# Patient Record
Sex: Female | Born: 1960 | Race: Black or African American | Hispanic: No | Marital: Single | Smoking: Former smoker
Health system: Southern US, Community
[De-identification: ages and names within clinical notes are randomized; demographics above are authoritative.]

## PROBLEM LIST (undated history)

## (undated) DIAGNOSIS — L84 Corns and callosities: Secondary | ICD-10-CM

## (undated) DIAGNOSIS — I309 Acute pericarditis, unspecified: Secondary | ICD-10-CM

## (undated) DIAGNOSIS — F329 Major depressive disorder, single episode, unspecified: Secondary | ICD-10-CM

## (undated) DIAGNOSIS — R45851 Suicidal ideations: Secondary | ICD-10-CM

## (undated) DIAGNOSIS — F32A Depression, unspecified: Secondary | ICD-10-CM

## (undated) HISTORY — DX: Corns and callosities: L84

## (undated) HISTORY — DX: Acute pericarditis, unspecified: I30.9

## (undated) HISTORY — PX: NO PAST SURGERIES: SHX2092

## (undated) HISTORY — PX: CARDIAC CATHETERIZATION: SHX172

---

## 1997-12-18 ENCOUNTER — Encounter: Payer: Self-pay | Admitting: Emergency Medicine

## 1997-12-18 ENCOUNTER — Inpatient Hospital Stay (HOSPITAL_COMMUNITY): Admission: EM | Admit: 1997-12-18 | Discharge: 1997-12-19 | Payer: Self-pay | Admitting: Emergency Medicine

## 1997-12-22 ENCOUNTER — Ambulatory Visit (HOSPITAL_COMMUNITY): Admission: RE | Admit: 1997-12-22 | Discharge: 1997-12-22 | Payer: Self-pay | Admitting: Internal Medicine

## 1998-07-22 ENCOUNTER — Emergency Department (HOSPITAL_COMMUNITY): Admission: EM | Admit: 1998-07-22 | Discharge: 1998-07-23 | Payer: Self-pay | Admitting: Emergency Medicine

## 1999-06-25 ENCOUNTER — Encounter: Payer: Self-pay | Admitting: Emergency Medicine

## 1999-06-25 ENCOUNTER — Emergency Department (HOSPITAL_COMMUNITY): Admission: EM | Admit: 1999-06-25 | Discharge: 1999-06-25 | Payer: Self-pay | Admitting: Emergency Medicine

## 2002-10-04 ENCOUNTER — Inpatient Hospital Stay (HOSPITAL_COMMUNITY): Admission: AD | Admit: 2002-10-04 | Discharge: 2002-10-09 | Payer: Self-pay | Admitting: Psychiatry

## 2003-07-12 ENCOUNTER — Other Ambulatory Visit: Admission: RE | Admit: 2003-07-12 | Discharge: 2003-07-12 | Payer: Self-pay | Admitting: Family Medicine

## 2003-11-04 ENCOUNTER — Ambulatory Visit: Payer: Self-pay | Admitting: Internal Medicine

## 2004-05-26 ENCOUNTER — Ambulatory Visit: Payer: Self-pay | Admitting: Internal Medicine

## 2004-05-29 ENCOUNTER — Ambulatory Visit: Payer: Self-pay | Admitting: Internal Medicine

## 2004-07-24 ENCOUNTER — Ambulatory Visit: Payer: Self-pay | Admitting: Internal Medicine

## 2004-10-16 ENCOUNTER — Ambulatory Visit: Payer: Self-pay | Admitting: Internal Medicine

## 2005-11-16 ENCOUNTER — Ambulatory Visit: Payer: Self-pay | Admitting: Family Medicine

## 2005-12-03 ENCOUNTER — Ambulatory Visit: Payer: Self-pay | Admitting: Family Medicine

## 2005-12-27 ENCOUNTER — Ambulatory Visit: Payer: Self-pay | Admitting: Internal Medicine

## 2006-02-19 ENCOUNTER — Ambulatory Visit (HOSPITAL_COMMUNITY): Admission: RE | Admit: 2006-02-19 | Discharge: 2006-02-19 | Payer: Self-pay | Admitting: Family Medicine

## 2006-02-22 ENCOUNTER — Ambulatory Visit: Payer: Self-pay | Admitting: Internal Medicine

## 2006-04-10 ENCOUNTER — Ambulatory Visit: Payer: Self-pay | Admitting: *Deleted

## 2006-10-16 ENCOUNTER — Encounter (INDEPENDENT_AMBULATORY_CARE_PROVIDER_SITE_OTHER): Payer: Self-pay | Admitting: *Deleted

## 2007-09-14 ENCOUNTER — Emergency Department (HOSPITAL_COMMUNITY): Admission: EM | Admit: 2007-09-14 | Discharge: 2007-09-14 | Payer: Self-pay | Admitting: Family Medicine

## 2008-08-04 ENCOUNTER — Emergency Department (HOSPITAL_COMMUNITY): Admission: EM | Admit: 2008-08-04 | Discharge: 2008-08-04 | Payer: Self-pay | Admitting: Emergency Medicine

## 2009-09-23 ENCOUNTER — Ambulatory Visit (HOSPITAL_COMMUNITY): Admission: RE | Admit: 2009-09-23 | Discharge: 2009-09-23 | Payer: Self-pay | Admitting: Internal Medicine

## 2009-09-23 ENCOUNTER — Ambulatory Visit: Payer: Self-pay | Admitting: Internal Medicine

## 2009-09-23 DIAGNOSIS — R079 Chest pain, unspecified: Secondary | ICD-10-CM

## 2009-09-23 DIAGNOSIS — F410 Panic disorder [episodic paroxysmal anxiety] without agoraphobia: Secondary | ICD-10-CM | POA: Insufficient documentation

## 2009-09-23 DIAGNOSIS — A5400 Gonococcal infection of lower genitourinary tract, unspecified: Secondary | ICD-10-CM | POA: Insufficient documentation

## 2009-09-23 DIAGNOSIS — G5600 Carpal tunnel syndrome, unspecified upper limb: Secondary | ICD-10-CM

## 2009-09-23 DIAGNOSIS — F329 Major depressive disorder, single episode, unspecified: Secondary | ICD-10-CM | POA: Insufficient documentation

## 2009-09-23 DIAGNOSIS — E049 Nontoxic goiter, unspecified: Secondary | ICD-10-CM | POA: Insufficient documentation

## 2009-09-23 DIAGNOSIS — H524 Presbyopia: Secondary | ICD-10-CM | POA: Insufficient documentation

## 2009-09-23 DIAGNOSIS — Z8619 Personal history of other infectious and parasitic diseases: Secondary | ICD-10-CM

## 2009-09-26 ENCOUNTER — Ambulatory Visit: Payer: Self-pay | Admitting: Physician Assistant

## 2009-09-26 ENCOUNTER — Encounter (INDEPENDENT_AMBULATORY_CARE_PROVIDER_SITE_OTHER): Payer: Self-pay | Admitting: Internal Medicine

## 2009-09-26 LAB — CONVERTED CEMR LAB
AST: 15 units/L (ref 0–37)
Albumin: 4.5 g/dL (ref 3.5–5.2)
BUN: 11 mg/dL (ref 6–23)
Basophils Relative: 1 % (ref 0–1)
Calcium: 9.7 mg/dL (ref 8.4–10.5)
Chloride: 103 meq/L (ref 96–112)
Cholesterol: 228 mg/dL — ABNORMAL HIGH (ref 0–200)
Creatinine, Ser: 0.7 mg/dL (ref 0.40–1.20)
Glucose, Bld: 93 mg/dL (ref 70–99)
HDL: 51 mg/dL (ref >39)
Hemoglobin: 13.6 g/dL (ref 12.0–15.0)
Lymphs Abs: 1.9 10*3/uL (ref 0.7–4.0)
MCHC: 31.7 g/dL (ref 30.0–36.0)
MCV: 89 fL (ref 78.0–100.0)
Monocytes Absolute: 0.6 10*3/uL (ref 0.1–1.0)
Monocytes Relative: 12 % (ref 3–12)
Neutro Abs: 2.5 10*3/uL (ref 1.7–7.7)
Neutrophils Relative %: 50 % (ref 43–77)
Potassium: 4.2 meq/L (ref 3.5–5.3)
RBC: 4.82 M/uL (ref 3.87–5.11)
Total CHOL/HDL Ratio: 4.5
Triglycerides: 95 mg/dL (ref <150)
WBC: 5.1 10*3/uL (ref 4.0–10.5)

## 2009-10-04 ENCOUNTER — Encounter (INDEPENDENT_AMBULATORY_CARE_PROVIDER_SITE_OTHER): Payer: Self-pay | Admitting: Internal Medicine

## 2009-10-10 ENCOUNTER — Ambulatory Visit (HOSPITAL_COMMUNITY): Admission: RE | Admit: 2009-10-10 | Discharge: 2009-10-10 | Payer: Self-pay | Admitting: Internal Medicine

## 2009-10-25 ENCOUNTER — Ambulatory Visit: Payer: Self-pay | Admitting: Internal Medicine

## 2009-10-25 DIAGNOSIS — E785 Hyperlipidemia, unspecified: Secondary | ICD-10-CM

## 2010-01-16 ENCOUNTER — Ambulatory Visit: Payer: Self-pay | Admitting: Internal Medicine

## 2010-02-07 ENCOUNTER — Ambulatory Visit: Admit: 2010-02-07 | Payer: Self-pay | Admitting: Internal Medicine

## 2010-02-19 ENCOUNTER — Encounter: Payer: Self-pay | Admitting: Internal Medicine

## 2010-02-26 LAB — CONVERTED CEMR LAB
Bilirubin Urine: NEGATIVE
Chlamydia, DNA Probe: NEGATIVE
Nitrite: NEGATIVE
Urobilinogen, UA: 0.2
pH: 7

## 2010-03-02 NOTE — Assessment & Plan Note (Signed)
Summary: CPP EXAM//GK   Vital Signs:  Patient profile:   50 year old female Height:      61.5 inches Weight:      157.5 pounds BMI:     29.38 Temp:     98.6 degrees F oral Pulse rate:   75 / minute Pulse rhythm:   regular Resp:     16 per minute BP sitting:   106 / 64  (left arm) Cuff size:   regular  Vitals Entered By: Michelle Nasuti (September 23, 2009 10:04 AM) CC: PT PRESENTS TO CLINIC TO ESTABLISH CARE. CPP Is Patient Diabetic? No Pain Assessment Patient in pain? no       Does patient need assistance? Functional Status Self care Ambulation Normal  Vision Screening:Left eye w/o correction: 20 / 30 Right Eye w/o correction: 20 / 40 Both eyes w/o correction:  20/ 40        Vision Entered By: Michelle Nasuti (September 23, 2009 10:09 AM)   CC:  PT PRESENTS TO CLINIC TO ESTABLISH CARE. CPP.  History of Present Illness: 50 yo female here to reestablish and for CPP--has not been seen here in at least 3 years and likely longer.  Concerns:  1.  Vision becoming a problem:  Difficulty seeing fine print.  Is using reading glasses--cannot say how high a strength she has tried.  2.  Chest pressure in past 4-6 weeks.  Intermittent.  2 Separate areas--squeezing pressure inward in 2 distinct areas of anterior left and anterior right chest--can radiate down both arms.  No radiation to neck, jaw or back.  Notes mainly with emotional stress/anxiety.  Not sure if related to eating.  No epigastric pain, burning or nausea associated.  Does feel dyspnea with this.  Lasts about 10 - 15 minutes.  Dissipates on its own, generally, though has taken aspirin and antacids with this--does not seem to matter.  Not very physically active currently.  Can be at work when occurs, but usually sitting and at rest when occurs.  Works as a Therapist, occupational.  Does occasionally have dark stools, but not black.  No blood in stool.  States had similar discomfort several  years ago for which she was seen in  ED--diagnosed with GERD.  Did not have a focused cardiac work up at that time.  Habits & Providers  Alcohol-Tobacco-Diet     Tobacco Status: current     Cigarette Packs/Day: 0.25  Current Medications (verified): 1)  None  Allergies (verified): 1)  Sulfa  Past History:  Past Medical History: Hx of GONORRHEA (ICD-098.0) CHLAMYDIAL INFECTION, HX OF (ICD-V12.09) Hx of DEPRESSION (ICD-311) CARPAL TUNNEL SYNDROME, LEFT (ICD-354.0) Hx of PANIC DISORDER (ICD-300.01) UNSPECIFIED BREAST SCREENING (ICD-V76.10)  Past Surgical History: 1. 1979:   Right breast biopsy:  benign cyst 2.  1988:  Urethral dilatation--hx of frequent UTIs 3.  1992:  Pelvic Laparoscopic surger for infertility--sounds like had hysteroscopy concurrently  to open fallopian tube 4.  2008:  Right bunionectomy and  3 hammer toe surgeries  Family History: Mother, died 26:  stroke, dementia, CAD with hx of CABG x3, hypertension Father, died 58:  series of strokes with seizures, hypertension. 7 siblings:  Brother, 82:  just suffered a stroke.                    Sister, 23:  CAD, with hx of stenting 3 years ago  Sister, 48:  thyroid disease. Son, 17:  Healthy Daughter, 15:  anger issues.  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  Smokes 1/2 ppd Alcohol Use:  1 beer daily Drug Use:  Hx of use--no IV drug use.  Cocaine and MJ --17 plus years ago.Smoking Status:  current Packs/Day:  0.25  Review of Systems General:  Gets tired at times.  Was feeling better when was physically active. Eyes:  Complains of blurring. ENT:  Denies decreased hearing. CV:  See HPI. Resp:  Denies cough and shortness of breath; See HPI. GI:  Denies abdominal pain, bloody stools, and diarrhea; constipation. GU:  Complains of urinary frequency; denies discharge and dysuria; No frequency at night. MS:  Denies  joint redness and joint swelling; Right thumb with occasional pain in MCP joint. Derm:  Denies lesion(s) and rash. Neuro:  Denies numbness, tingling, and weakness. Psych:  Denies anxiety and depression; PHQ9 with score of 2.  Physical Exam  General:  Well-developed,well-nourished,in no acute distress; alert,appropriate and cooperative throughout examination Head:  Normocephalic and atraumatic without obvious abnormalities. No apparent alopecia or balding. Eyes:  No corneal or conjunctival inflammation noted. EOMI. Perrla. Funduscopic exam benign, without hemorrhages, exudates or papilledema. Vision grossly normal.  No exophthalmos, no lid lag Ears:  External ear exam shows no significant lesions or deformities.  Otoscopic examination reveals clear canals, tympanic membranes are intact bilaterally without bulging, retraction, inflammation or discharge. Hearing is grossly normal bilaterally. Nose:  External nasal examination shows no deformity or inflammation. Nasal mucosa are pink and moist without lesions or exudates. Mouth:  Oral mucosa and oropharynx without lesions or exudates.  Teeth in good repair. Neck:  No deformities,or tenderness noted.  Does have diffuse thyromegaly without overlying bruit Breasts:  No mass, nodules, thickening, tenderness, bulging, retraction, inflamation, nipple discharge or skin changes noted.   Lungs:  Normal respiratory effort, chest expands symmetrically. Lungs are clear to auscultation, no crackles or wheezes. Heart:  Normal rate and regular rhythm. S1 and S2 normal without gallop, murmur, click, rub or other extra sounds. Abdomen:  Bowel sounds positive,abdomen soft and non-tender without masses, organomegaly or hernias noted. Rectal:  No external abnormalities noted. Normal sphincter tone. No rectal masses or tenderness.  Heme negative light brown stool. Genitalia:  Pelvic Exam:        External: normal female genitalia without lesions or masses        Vagina:  normal without lesions or masses        Cervix: normal without lesions or masses        Adnexa: normal bimanual exam without masses or fullness        Uterus: normal by palpation        Pap smear: performed Msk:  No deformity or scoliosis noted of thoracic or lumbar spine.   Pulses:  R and L carotid,radial,femoral,dorsalis pedis and posterior tibial pulses are full and equal bilaterally Extremities:  No clubbing, cyanosis, edema, or deformity noted with normal full range of motion of all joints.   Neurologic:  No cranial nerve deficits noted. Station and gait are normal. Plantar reflexes are down-going bilaterally. DTRs are symmetrical throughout. Sensory, motor and coordinative functions appear intact. Skin:  Intact without suspicious lesions or rashes Cervical Nodes:  No lymphadenopathy noted Axillary Nodes:  No palpable lymphadenopathy Inguinal Nodes:  No significant adenopathy Psych:  Cognition and judgment appear intact. Alert  and cooperative with normal attention span and concentration. No apparent delusions, illusions, hallucinations   Impression & Recommendations:  Problem # 1:  ROUTINE GYNECOLOGICAL EXAMINATION (ICD-V72.31) Mammogram scheduled Orders: UA Dipstick w/o Micro (manual) ([address]) Pap Smear, Thin Prep ( Collection of) [phone]) KOH/ WET Mount [phone]) T- GC Chlamydia ([address]1478) T-Pap Smear, Thin Prep (29562)  Problem # 2:  HEALTH SCREENING (ICD-V70.0) Tdap given Guaiac cards x3 to return in 2 weeks.  Problem # 3:  CHEST PAIN (ICD-786.50) Suspect GI in origin as has had before and felt to be GI related--responded to treatment To start Famotidine--pt. will pick up otc for the weekend and take 40 mg Pt. not fasting today, however, and will have her return to check FLP, chemistries. Orders: EKG w/ Interpretation ([address])--really appears normal with early repolarization changes on almost every lead. 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 1 tab by mouth daily at bedtime.  Other Orders: Radiology Referral (Radiology) Tdap => 17yrs IM ([address]) Admin 1st Vaccine (57846) Admin 1st Vaccine [facility]) [phone])  Patient Instructions: 1)  Return beginning of next week for fasting labs:  FLP, CBC, CMET--chest pain     TSH for thyromegaly,    HIV, RPR for routine gynecological exam 2)  Follow up with Dr. Delrae Alfred in 1 month  --chest pain 3)  Elevate head of bed, stop smoking, do not lie down for 2 hours after eating anything.  Avoid Aleve, ibuprofen, chocolate, caffeine, onions, tomatoes and tomato past/sauce. 4)  Take a baby aspirin daily with food. 5)  For the weekend--take 40 mg of generic Pepcid AC at bedtime--until can fill at [facility] pharmacy Prescriptions: FAMOTIDINE 40 MG TABS (FAMOTIDINE) 1 tab by mouth daily at bedtime.  #30 x 2   Entered and Authorized by:   Julieanne Manson MD   Signed by:   Julieanne Manson MD on 09/23/2009   Method used:   Faxed to ...       [facility] (retail)       [address] [place], [place]  [address]       Ph: [phone]       Fax: [phone]   RxID:   [id number]   Laboratory Results   Urine Tests  Date/Time Received: September 23, 2009 10:14 AM   Routine Urinalysis   Color: lt. yellow Appearance: Clear Glucose: negative   (Normal Range: Negative) Bilirubin: negative   (Normal Range: Negative) Ketone: trace (5)   (Normal Range: Negative) Spec. Gravity: 1.015   (Normal Range: 1.003-1.035) Blood: trace-intact   (Normal Range: Negative) pH: 7.0   (Normal Range: 5.0-8.0) Protein: negative   (Normal Range: Negative) Urobilinogen: 0.2   (Normal Range:  0-1) Nitrite: negative   (Normal Range: Negative) Leukocyte Esterace: negative   (Normal Range: Negative)      Wet Mount/KOH Source: vaginal WBC/hpf: 1-5 Bacteria/hpf: 1+ Clue cells/hpf: none  Negative whiff Yeast/hpf: none Trichomonas/hpf: none     Preventive Care Screening     Last Mammogram:  2 years ago--states always normal--we have a normal one from 2008. SBE:  Occasionally--no changes Last Pap:  over 1 year ago--has had an abnormal in distant past, but never needed to repeat pap per  pt. LMP:  3 years ago.  Still having hot flashes and night sweats. Osteoprevention:  No regular dairy intake--causes nausea.  Does not take calcium or Vitamin D.  Not physically active. Guaiac Cards:  never Colonoscopy:  never.    Tetanus/Td Vaccine    Vaccine Type: Tdap    Site: right deltoid    Mfr: GlaxoSmithKline    Dose: 0.5 ml    Route: IM    Given by: Michelle Nasuti    Exp. Date: 10/16/2010    Lot #: [id number]    VIS given: 12/17/06 version given September 23, 2009.

## 2010-03-02 NOTE — Progress Notes (Signed)
Summary: Office Visit//DEPRESSION SCREENING  Office Visit//DEPRESSION SCREENING   Imported By: Arta Bruce 09/27/2009 11:53:07  _____________________________________________________________________  External Attachment:    Type:   Image     Comment:   External Document

## 2010-03-02 NOTE — Assessment & Plan Note (Signed)
Summary: 1 MO F/U////RJE   Vital Signs:  Patient profile:   50 year old female Height:      61.5 inches Weight:      160.8 pounds BMI:     30.00 Temp:     97.2 degrees F Pulse rate:   78 / minute Pulse rhythm:   regular Resp:     16 per minute BP sitting:   104 / 64  (left arm) Cuff size:   large  Vitals Entered By: CMA Student Linzie Collin CC: 1 month F/U ,patient states no current issues, patient not currently on prescribed medications Is Patient Diabetic? No Pain Assessment Patient in pain? no       Does patient need assistance? Functional Status Self care, Shopping Ambulation Normal   CC:  1 month F/U , patient states no current issues, and patient not currently on prescribed medications.  History of Present Illness: 1.  Chest Pain:  Had discomfort for 3 more days after last seen and then resolved. Pt. did not fill the Famotidine, for some reason, just went and got Pepcid AC--did help.  Took it regularly for 1 week and has not needed since.  Cut back on smoking, tomatoes and seems to have helped.  Did not elevate HOB.  CXR and EKG were okay.  At end of visit, pt. states she has odd aching in left chest at times--this is different than chest discomfort she was describing at CPP.  2.  Hyperlipidemia:  pt. working on diet and exercise.    Allergies (verified): 1)  Sulfa  Physical Exam  Chest Wall:  NT over chest wall. Lungs:  Normal respiratory effort, chest expands symmetrically. Lungs are clear to auscultation, no crackles or wheezes. Heart:  Normal rate and regular rhythm. S1 and S2 normal without gallop, murmur, click, rub or other extra sounds.  Radial pulses normal and equal. Abdomen:  Bowel sounds positive,abdomen soft and non-tender without masses, organomegaly or hernias noted.   Impression & Recommendations:  Problem # 1:  CHEST PAIN (ICD-786.50) Pt. to take Famotidine regularly and follow up as originally planned while taking med.  Problem # 2:   HYPERLIPIDEMIA, MILD (ICD-272.4) To continue to work on lifestyle changes  Complete Medication List: 1)  Famotidine 20 Mg Tabs (Famotidine) .... 2 tabs by mouth at bedtime as needed gerd symptoms  Patient Instructions: 1)  Fasting labs for FLP in 3 months. 2)  Follow up with Dr. Delrae Alfred in 2 months --chest discomfort. Prescriptions: FAMOTIDINE 20 MG TABS (FAMOTIDINE) 2 tabs by mouth at bedtime as needed GERD symptoms  #60 x 2   Entered and Authorized by:   Julieanne Manson MD   Signed by:   Julieanne Manson MD on 10/25/2009   Method used:   Electronically to        [facility] [phone]* (retail)       [address]       [place], [place]  [address]       Ph: [phone]       Fax: [phone]   RxID:   [id number]

## 2010-03-02 NOTE — Letter (Signed)
Summary: Lipid Letter  [facility]  [address] [place], [place] [address]   Phone: [phone]  Fax: [phone]    10/04/2009  Haley Turner [address] [place], [place]  [address]  Dear Haley Turner:  We have carefully reviewed your last lipid profile from 09/26/2009 and the results are noted below with a summary of recommendations for lipid management.    Cholesterol:       228     Goal: <200   HDL "good" Cholesterol:   51     Goal: >45   LDL "bad" Cholesterol:   158     Goal: <100   Triglycerides:       95     Goal: <150    Your cholesterol is a bit high--work on eating 5 servings of mainly vegetables, but also fruits daily, and avoid fried, fatty foods.  Work on regular physical activity as well.   Your pap, STD labs, blood cell counts, kidney, liver function, thyroid testing was all okay    TLC Diet (Therapeutic Lifestyle Change): Saturated Fats & Transfatty acids should be kept < 7% of total calories ***Reduce Saturated Fats Polyunstaurated Fat can be up to 10% of total calories Monounsaturated Fat Fat can be up to 20% of total calories Total Fat should be no greater than 25-35% of total calories Carbohydrates should be 50-60% of total calories Protein should be approximately 15% of total calories Fiber should be at least 20-30 grams a day ***Increased fiber may help lower LDL Total Cholesterol should be < 200mg /day Consider adding plant stanol/sterols to diet (example: Benacol spread) ***A higher intake of unsaturated fat may reduce Triglycerides and Increase HDL    Adjunctive Measures (may lower LIPIDS and reduce risk of Heart Attack) include: Aerobic Exercise (20-30 minutes 3-4 times a week) Limit Alcohol Consumption Weight Reduction Aspirin 75-81 mg a day by mouth (if not allergic or contraindicated) Dietary Fiber 20-30 grams a day by mouth     Current Medications: 1)    Famotidine 40 Mg Tabs (Famotidine) .Marland Kitchen.. 1 tab by mouth daily at bedtime.  If  you have any questions, please call. We appreciate being able to work with you.   Sincerely,    [facility]

## 2010-06-16 NOTE — Discharge Summary (Signed)
NAME:  Haley Turner, Haley Turner                        ACCOUNT NO.:  [id number]   MEDICAL RECORD NO.:  [id number]                   PATIENT TYPE:  IPS   LOCATION:  0304                                 FACILITY:  [facility]   PHYSICIAN:  Jeanice Lim, M.D.              DATE OF BIRTH:  1960/07/15   DATE OF ADMISSION:  10/04/2002  DATE OF DISCHARGE:  10/09/2002                                 DISCHARGE SUMMARY   IDENTIFYING DATA:  This is a 50 year old African-American female, single,  voluntarily admitted, presenting to the hospital with thoughts of her  children being better off without her.  She had no clear plan for suicide  but felt that she was a burden to everyone.  Had been depressed for almost  two years after the break up of an eight-year relationship with her  children's father.  She had a history of some compulsive gambling and a  relapse over that year and found herself unable to stop gambling and pawning  things.  The patient was concerned what the family would feel when they  realized what she had done.  The patient was not sleeping and had had  suicidal thoughts for the past two weeks.   MEDICATIONS:  None.   ALLERGIES:  SULFA.   PHYSICAL EXAMINATION:  Essentially within normal limits.  Neurologically  nonfocal.   LABORATORY DATA:  CBC and CMET within normal limits.  TSH within normal  limits.  Urinalysis negative.  Urine drug screen negative.   MENTAL STATUS EXAM:  Fully alert female in no acute distress.  Pleasant and  polite.  No psychomotor abnormalities.  Speech clear.  Mood depressed,  irritable.  Thought processes goal directed.  Thought content negative for  dangerous ideation or psychotic symptoms.  Cognitively intact.  Judgment and  insight fair to poor with a history of poor impulse control.   ADMISSION DIAGNOSES:   AXIS I:  1. Major depressive disorder, recurrent, severe.  2. Rule out obsessive-compulsive disorder not otherwise specified.  3. Gambling  addiction.   AXIS II:  Deferred.   AXIS III:  None.   AXIS IV:  Moderate to severe (economic problems, stress of being a single  parent).   AXIS V:  25/65.   HOSPITAL COURSE:  The patient was admitted and ordered routine p.r.n.  medications and underwent further monitoring.  Was encouraged to  participated in individual, group and milieu therapy.  The patient was  started on Paxil, targeting depressive and obsessive symptoms.  Family  meeting with sister.  Paxil was optimized.  The patient participated in  therapy as well as aftercare planning and reported a positive response to  medication changes and clinical interventions.   CONDITION ON DISCHARGE:  Improved.  Mood was more euthymic.  Affect  brighter.  Thought processes goal directed.  Thought content negative for  dangerous ideation or psychotic symptoms.  The patient reported motivation  to  be compliant with the follow-up plan and felt that she was able to cope  with her multiple stressors and seek therapy for her behaviors that have  increased her stress.   DISCHARGE MEDICATIONS:  1. Paxil CR 25 mg q.a.m.  2. Ambien 10 mg q.h.s. p.r.n. insomnia.   FOLLOW UP:  The patient was to follow up with [facility]  on October 15, 2002 at 11 a.m. and on November 09, 2002 at 2 p.m.  The  patient was also recommended to follow up with her primary care physician  within 7-10 days to monitor Paxil response, tolerance and safety issues.   DISCHARGE DIAGNOSES:   AXIS I:  1. Major depressive disorder, recurrent, severe.  2. Rule out obsessive-compulsive disorder not otherwise specified.  3. Gambling addiction.   AXIS II:  Deferred.   AXIS III:  None.   AXIS IV:  Moderate to severe (economic problems, stress of being a single  parent).   AXIS V:  Global Assessment of Functioning on discharge 55.                                               Jeanice Lim, M.D.    JEM/MEDQ  D:  10/24/2002  T:   10/26/2002  Job:  [id number]
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
## 2010-10-25 ENCOUNTER — Inpatient Hospital Stay (INDEPENDENT_AMBULATORY_CARE_PROVIDER_SITE_OTHER)
Admission: RE | Admit: 2010-10-25 | Discharge: 2010-10-25 | Disposition: A | Discharge status: Home / Self Care | Payer: Self-pay | Source: Ambulatory Visit | Attending: Family Medicine | Admitting: Family Medicine

## 2010-10-25 DIAGNOSIS — M1990 Unspecified osteoarthritis, unspecified site: Secondary | ICD-10-CM

## 2011-03-11 ENCOUNTER — Inpatient Hospital Stay (HOSPITAL_COMMUNITY)
Admission: EM | Admit: 2011-03-11 | Discharge: 2011-03-13 | DRG: 287 | Disposition: A | Discharge status: Home / Self Care | Payer: Medicaid Other | Attending: Cardiology | Admitting: Cardiology

## 2011-03-11 ENCOUNTER — Other Ambulatory Visit: Payer: Self-pay

## 2011-03-11 ENCOUNTER — Emergency Department (HOSPITAL_COMMUNITY): Payer: Medicaid Other

## 2011-03-11 ENCOUNTER — Encounter (HOSPITAL_COMMUNITY): Payer: Self-pay | Admitting: *Deleted

## 2011-03-11 DIAGNOSIS — E785 Hyperlipidemia, unspecified: Secondary | ICD-10-CM | POA: Diagnosis present

## 2011-03-11 DIAGNOSIS — I309 Acute pericarditis, unspecified: Secondary | ICD-10-CM | POA: Diagnosis present

## 2011-03-11 DIAGNOSIS — R0789 Other chest pain: Secondary | ICD-10-CM

## 2011-03-11 DIAGNOSIS — I319 Disease of pericardium, unspecified: Principal | ICD-10-CM | POA: Diagnosis present

## 2011-03-11 DIAGNOSIS — I214 Non-ST elevation (NSTEMI) myocardial infarction: Secondary | ICD-10-CM | POA: Diagnosis present

## 2011-03-11 DIAGNOSIS — F17200 Nicotine dependence, unspecified, uncomplicated: Secondary | ICD-10-CM | POA: Diagnosis present

## 2011-03-11 DIAGNOSIS — Z882 Allergy status to sulfonamides status: Secondary | ICD-10-CM

## 2011-03-11 LAB — DIFFERENTIAL
Lymphocytes Relative: 35 % (ref 12–46)
Monocytes Absolute: 1 10*3/uL (ref 0.1–1.0)
Monocytes Relative: 16 % — ABNORMAL HIGH (ref 3–12)
Neutro Abs: 2.8 10*3/uL (ref 1.7–7.7)
Neutrophils Relative %: 47 % (ref 43–77)

## 2011-03-11 LAB — CBC
HCT: 40.6 % (ref 36.0–46.0)
Hemoglobin: 13.2 g/dL (ref 12.0–15.0)
RBC: 4.7 MIL/uL (ref 3.87–5.11)
WBC: 5.9 10*3/uL (ref 4.0–10.5)

## 2011-03-11 LAB — POCT I-STAT TROPONIN I

## 2011-03-11 LAB — PROTIME-INR
INR: 1.02 (ref 0.00–1.49)
Prothrombin Time: 13.6 seconds (ref 11.6–15.2)

## 2011-03-11 LAB — CARDIAC PANEL(CRET KIN+CKTOT+MB+TROPI)
CK, MB: 8.3 ng/mL (ref 0.3–4.0)
Total CK: 255 U/L — ABNORMAL HIGH (ref 7–177)
Troponin I: 2.35 ng/mL (ref <0.30)

## 2011-03-11 LAB — BASIC METABOLIC PANEL
BUN: 10 mg/dL (ref 6–23)
CO2: 25 mEq/L (ref 19–32)
Chloride: 107 mEq/L (ref 96–112)
Creatinine, Ser: 0.64 mg/dL (ref 0.50–1.10)
Potassium: 3.6 mEq/L (ref 3.5–5.1)

## 2011-03-11 LAB — TROPONIN I
Troponin I: <0.3 ng/mL (ref <0.30)
Troponin I: 0.58 ng/mL (ref <0.30)

## 2011-03-11 MED ORDER — HEPARIN SODIUM (PORCINE) 5000 UNIT/ML IJ SOLN
INTRAMUSCULAR | Status: AC
  Administered 2011-03-11: 20:00:00
  Filled 2011-03-11: qty 1

## 2011-03-11 MED ORDER — EPTIFIBATIDE 75 MG/100ML IV SOLN
2.0000 ug/kg/min | INTRAVENOUS | Status: DC
  Administered 2011-03-12 (×2): 2 ug/kg/min via INTRAVENOUS
  Filled 2011-03-11 (×4): qty 100

## 2011-03-11 MED ORDER — HEPARIN SOD (PORCINE) IN D5W 100 UNIT/ML IV SOLN
1050.0000 [IU]/h | INTRAVENOUS | Status: DC
  Administered 2011-03-11 (×2): 850 [IU]/h via INTRAVENOUS
  Administered 2011-03-12: 1050 [IU]/h via INTRAVENOUS
  Filled 2011-03-11 (×4): qty 250

## 2011-03-11 MED ORDER — ASPIRIN EC 81 MG PO TBEC
81.0000 mg | DELAYED_RELEASE_TABLET | Freq: Every day | ORAL | Status: DC
  Administered 2011-03-13: 81 mg via ORAL
  Filled 2011-03-11 (×2): qty 1

## 2011-03-11 MED ORDER — KETOROLAC TROMETHAMINE 30 MG/ML IJ SOLN
30.0000 mg | Freq: Once | INTRAMUSCULAR | Status: AC
  Administered 2011-03-11: 30 mg via INTRAVENOUS
  Filled 2011-03-11: qty 1

## 2011-03-11 MED ORDER — HEPARIN BOLUS VIA INFUSION
4000.0000 [IU] | Freq: Once | INTRAVENOUS | Status: DC
  Administered 2011-03-12: 4000 [IU] via INTRAVENOUS

## 2011-03-11 MED ORDER — HYDROMORPHONE HCL PF 1 MG/ML IJ SOLN
1.0000 mg | Freq: Once | INTRAMUSCULAR | Status: AC
  Administered 2011-03-11: 1 mg via INTRAVENOUS
  Filled 2011-03-11: qty 1

## 2011-03-11 MED ORDER — GI COCKTAIL ~~LOC~~
30.0000 mL | Freq: Once | ORAL | Status: AC
  Administered 2011-03-11: 30 mL via ORAL
  Filled 2011-03-11: qty 30

## 2011-03-11 MED ORDER — ASPIRIN 300 MG RE SUPP: 300.0000 mg | RECTAL | Status: DC

## 2011-03-11 MED ORDER — NITROGLYCERIN 0.4 MG SL SUBL: 0.4000 mg | SUBLINGUAL_TABLET | SUBLINGUAL | Status: DC | PRN

## 2011-03-11 MED ORDER — EPTIFIBATIDE BOLUS VIA INFUSION
180.0000 ug/kg | Freq: Once | INTRAVENOUS | Status: AC
  Administered 2011-03-12: 13100 ug via INTRAVENOUS
  Filled 2011-03-11: qty 18

## 2011-03-11 MED ORDER — ONDANSETRON HCL 4 MG/2ML IJ SOLN: 4.0000 mg | Freq: Four times a day (QID) | INTRAMUSCULAR | Status: DC | PRN

## 2011-03-11 MED ORDER — METOPROLOL TARTRATE 25 MG PO TABS
25.0000 mg | ORAL_TABLET | Freq: Two times a day (BID) | ORAL | Status: DC
  Administered 2011-03-12 – 2011-03-13 (×4): 25 mg via ORAL
  Filled 2011-03-11 (×6): qty 1

## 2011-03-11 MED ORDER — ACETAMINOPHEN 325 MG PO TABS
650.0000 mg | ORAL_TABLET | ORAL | Status: DC | PRN
  Administered 2011-03-12: 650 mg via ORAL
  Filled 2011-03-11: qty 2

## 2011-03-11 MED ORDER — ASPIRIN 81 MG PO CHEW
324.0000 mg | CHEWABLE_TABLET | Freq: Once | ORAL | Status: AC
  Administered 2011-03-11: 324 mg via ORAL
  Filled 2011-03-11: qty 1
  Filled 2011-03-11: qty 4

## 2011-03-11 MED ORDER — NITROGLYCERIN IN D5W 200-5 MCG/ML-% IV SOLN
5.0000 ug/min | Freq: Once | INTRAVENOUS | Status: AC
  Administered 2011-03-11: 5 ug/min via INTRAVENOUS
  Filled 2011-03-11: qty 250

## 2011-03-11 MED ORDER — ROSUVASTATIN CALCIUM 10 MG PO TABS
10.0000 mg | ORAL_TABLET | Freq: Every day | ORAL | Status: DC
  Filled 2011-03-11 (×3): qty 1

## 2011-03-11 MED ORDER — ASPIRIN 81 MG PO CHEW
324.0000 mg | CHEWABLE_TABLET | ORAL | Status: DC
  Filled 2011-03-11: qty 4

## 2011-03-11 MED ORDER — NITROGLYCERIN IN D5W 200-5 MCG/ML-% IV SOLN
5.0000 ug/min | INTRAVENOUS | Status: DC
  Administered 2011-03-11: 5 ug/min via INTRAVENOUS

## 2011-03-11 NOTE — ED Notes (Signed)
Cardiologist at the bedside for evaluation of patient for admission.  New orders received. P tremains stable with no verbal complaints at this time.

## 2011-03-11 NOTE — ED Provider Notes (Cosign Needed)
4:45 PM Repeat POC troponin I was high at 0.15; initial lab TNI was <.3, WNL. Will repeat lab TNI.  Carleene Cooper III, M.D.   6:16 PM 3rd troponin was also high.  She is having chest pain.  DX:  NSTEMI.    Call to cardiology to admit her.  Rx with IV heparin and IV nitroglycerin.  6:30 PM Spoke to Dr. Margo Aye, who will see pt.   Carleene Cooper III, MD 03/11/11 [phone]

## 2011-03-11 NOTE — ED Notes (Signed)
Reports mid and right side chest pains that started around 30 mins ago while at church. Reports pain increases with movement, no n/v or sob. ekg done at triage, no acute distress noted.

## 2011-03-11 NOTE — H&P (Signed)
51 yo BF with DLD admitted with NSTEMI.  NPO for cath in am.  Will add heparin and integrillin to ASA.  Start metoprolol and Crestor.  See dictated note for details.

## 2011-03-11 NOTE — Progress Notes (Signed)
ANTICOAGULATION CONSULT NOTE - Initial Consult  Pharmacy Consult for Heparin Indication: chest pain/ACS  Allergies  Allergen Reactions  . Sulfonamide Derivatives     REACTION: Pruritic rash    Patient Measurements: Height: 5' 3.5" (161.3 cm) Weight: 160 lb (72.576 kg) IBW/kg (Calculated) : 53.55  Heparin Dosing Weight: 68 kg  Vital Signs: Temp: 97.7 F (36.5 C) (02/10 1301) Temp src: Oral (02/10 1301) BP: 121/62 mmHg (02/10 1842) Pulse Rate: 85  (02/10 1842)  Labs:  Basename 03/11/11 1645 03/11/11 1359  HGB -- 13.2  HCT -- 40.6  PLT -- 231  APTT -- --  LABPROT -- --  INR -- --  HEPARINUNFRC -- --  CREATININE -- 0.64  CKTOTAL -- --  CKMB -- --  TROPONINI 0.58* <0.30   Estimated Creatinine Clearance: 81.3 ml/min (by C-G formula based on Cr of 0.64).  Medical History: History reviewed. No pertinent past medical history.   Assessment: 51 y.o. F to start heparin for ACS sx, cardiac enzymes positive. The patient states that she was not taking any anticoagulants PTA, no recent surgery or stroke. Hgb/Hct/Plt ok. Heparin dosing wt~68 kg, SCr 0.64, CrCl~80-90 ml/min.   Goal of Therapy:  Heparin level 0.3-0.7  Plan:  1. Heparin bolus of 4000 units x 1 2. Initiate heparin drip at rate of 850 units/hr (8.5 ml/hr) 3. Daily heparin levels starting on 2/12 a.m. 3. Will continue to monitor for any signs/symptoms of bleeding and will follow up with heparin level in 6 hours   Georgina Pillion, PharmD, BCPS Clinical Pharmacist Pager: [phone] 03/11/2011 6:50 PM

## 2011-03-11 NOTE — ED Provider Notes (Signed)
History     CSN: [id number]  Arrival date & time 03/11/11  1251   First MD Initiated Contact with Patient 03/11/11 1344      Chief Complaint  Patient presents with  . Chest Pain    (Consider location/radiation/quality/duration/timing/severity/associated sxs/prior treatment) Patient is a 51 y.o. female presenting with chest pain. The history is provided by the patient. No language interpreter was used.  Chest Pain Episode onset: 2-3 hours ago. Chest pain occurs constantly. The chest pain is unchanged. The severity of the pain is moderate. The quality of the pain is described as aching and pressure-like. Radiates to: remains localized to the central and right chest. Pertinent negatives for primary symptoms include no fever, no fatigue, no syncope, no shortness of breath, no cough, no wheezing, no palpitations, no abdominal pain, no nausea, no vomiting and no dizziness.  Pertinent negatives for associated symptoms include no numbness and no weakness. She tried nothing for the symptoms.     History reviewed. No pertinent past medical history.  History reviewed. No pertinent past surgical history.  History reviewed. No pertinent family history.  History  Substance Use Topics  . Smoking status: Current Everyday Smoker -- 0.5 packs/day    Types: Cigarettes  . Smokeless tobacco: Not on file  . Alcohol Use: No    OB History    Grav Para Term Preterm Abortions TAB SAB Ect Mult Living                  Review of Systems  Constitutional: Negative for fever, activity change, appetite change and fatigue.  HENT: Negative for congestion, sore throat, rhinorrhea, neck pain and neck stiffness.   Respiratory: Negative for cough, shortness of breath and wheezing.   Cardiovascular: Positive for chest pain. Negative for palpitations and syncope.  Gastrointestinal: Negative for nausea, vomiting and abdominal pain.  Genitourinary: Negative for dysuria, urgency, frequency and flank pain.    Musculoskeletal: Negative for myalgias, back pain and arthralgias.  Neurological: Negative for dizziness, weakness, light-headedness, numbness and headaches.  All other systems reviewed and are negative.    Allergies  Sulfonamide derivatives  Home Medications  No current outpatient prescriptions on file.  BP 159/76  Pulse 84  Temp(Src) 97.7 F (36.5 C) (Oral)  Resp 22  SpO2 100%  Physical Exam  Nursing note and vitals reviewed. Constitutional: She is oriented to person, place, and time. She appears well-developed and well-nourished. No distress.  HENT:  Head: Normocephalic and atraumatic.  Mouth/Throat: Oropharynx is clear and moist.  Eyes: Conjunctivae and EOM are normal. Pupils are equal, round, and reactive to light.  Neck: Normal range of motion. Neck supple.  Cardiovascular: Normal rate, regular rhythm, normal heart sounds and intact distal pulses.  Exam reveals no gallop and no friction rub.   No murmur heard. Pulmonary/Chest: Effort normal and breath sounds normal. No respiratory distress. She exhibits tenderness (diffuse chest wall tenderness on palpation).  Abdominal: Soft. Bowel sounds are normal.  Musculoskeletal: Normal range of motion. She exhibits no tenderness.  Neurological: She is alert and oriented to person, place, and time. No cranial nerve deficit.  Skin: Skin is warm and dry. No rash noted.    ED Course  Procedures (including critical care time)   Date: 03/11/2011  Rate: 83  Rhythm: normal sinus rhythm  QRS Axis: normal  Intervals: normal  ST/T Wave abnormalities: normal  Conduction Disutrbances:none  Narrative Interpretation:   Old EKG Reviewed: unchanged  Labs Reviewed  DIFFERENTIAL - Abnormal; Notable for the  following:    Monocytes Relative 16 (*)    All other components within normal limits  CBC  BASIC METABOLIC PANEL  TROPONIN I  URINALYSIS, ROUTINE W REFLEX MICROSCOPIC   Dg Chest 2 View  03/11/2011  *RADIOLOGY REPORT*  Clinical  Data: Chest pain  CHEST - 2 VIEW  Comparison:  09/23/2009  Findings: Cardiomediastinal silhouette is stable.  No acute infiltrate or pleural effusion.  No pulmonary edema.  Bony thorax is stable.  IMPRESSION: No active disease.  No significant change.  Original Report Authenticated By: Natasha Mead, M.D.     1. Chest pain, atypical       MDM  Patient presents with chest pain. Is atypical for cardiac presentation however she does have a family history. Heart score is less than 2. She is low risk for cardiac etiology to her chest pain. She'll be placed in the CDU under the chest pain protocol. She is pain-free at this time. The CDU orders were placed. She'll receive a cardiac CT. Aspirin was administered.  Discussed with the cdu PA        Dayton Bailiff, MD 03/11/11 1550

## 2011-03-11 NOTE — ED Notes (Addendum)
Called the floor to give report on the pt and spoke with Britta Mccreedy, Charity fundraiser. Pt prepared for transport to  the floor.

## 2011-03-12 ENCOUNTER — Encounter (HOSPITAL_COMMUNITY): Payer: Self-pay | Admitting: *Deleted

## 2011-03-12 ENCOUNTER — Encounter (HOSPITAL_COMMUNITY)
Admission: EM | Disposition: A | Discharge status: Home / Self Care | Payer: Self-pay | Source: Home / Self Care | Attending: Cardiology

## 2011-03-12 HISTORY — PX: LEFT HEART CATHETERIZATION WITH CORONARY ANGIOGRAM: SHX5451

## 2011-03-12 LAB — BASIC METABOLIC PANEL
BUN: 17 mg/dL (ref 6–23)
Creatinine, Ser: 0.71 mg/dL (ref 0.50–1.10)
GFR calc Af Amer: >90 mL/min (ref >90)
GFR calc non Af Amer: >90 mL/min (ref >90)

## 2011-03-12 LAB — CBC
HCT: 36.6 % (ref 36.0–46.0)
MCH: 28.8 pg (ref 26.0–34.0)
MCHC: 33.3 g/dL (ref 30.0–36.0)
MCV: 86.3 fL (ref 78.0–100.0)
Platelets: 219 10*3/uL (ref 150–400)
RDW: 13.2 % (ref 11.5–15.5)

## 2011-03-12 LAB — LIPID PANEL
HDL: 42 mg/dL (ref >39)
LDL Cholesterol: 103 mg/dL — ABNORMAL HIGH (ref 0–99)

## 2011-03-12 LAB — CARDIAC PANEL(CRET KIN+CKTOT+MB+TROPI)
CK, MB: 7.4 ng/mL (ref 0.3–4.0)
CK, MB: 6.1 ng/mL (ref 0.3–4.0)
Relative Index: 2.8 — ABNORMAL HIGH (ref 0.0–2.5)
Total CK: 231 U/L — ABNORMAL HIGH (ref 7–177)
Total CK: 215 U/L — ABNORMAL HIGH (ref 7–177)

## 2011-03-12 LAB — TSH: TSH: 1.91 u[IU]/mL (ref 0.350–4.500)

## 2011-03-12 SURGERY — LEFT HEART CATHETERIZATION WITH CORONARY ANGIOGRAM
Anesthesia: LOCAL

## 2011-03-12 MED ORDER — FENTANYL CITRATE 0.05 MG/ML IJ SOLN
INTRAMUSCULAR | Status: AC
  Filled 2011-03-12: qty 2

## 2011-03-12 MED ORDER — DIAZEPAM 5 MG PO TABS
5.0000 mg | ORAL_TABLET | ORAL | Status: AC
  Administered 2011-03-12: 5 mg via ORAL
  Filled 2011-03-12: qty 1

## 2011-03-12 MED ORDER — OXYCODONE-ACETAMINOPHEN 5-325 MG PO TABS: 1.0000 | ORAL_TABLET | ORAL | Status: DC | PRN

## 2011-03-12 MED ORDER — HEPARIN SODIUM (PORCINE) 1000 UNIT/ML IJ SOLN
INTRAMUSCULAR | Status: AC
  Filled 2011-03-12: qty 1

## 2011-03-12 MED ORDER — MIDAZOLAM HCL 2 MG/2ML IJ SOLN
INTRAMUSCULAR | Status: AC
  Filled 2011-03-12: qty 2

## 2011-03-12 MED ORDER — SODIUM CHLORIDE 0.9 % IJ SOLN
3.0000 mL | Freq: Two times a day (BID) | INTRAMUSCULAR | Status: DC
  Administered 2011-03-13: 3 mL via INTRAVENOUS

## 2011-03-12 MED ORDER — SODIUM CHLORIDE 0.9 % IV SOLN: 250.0000 mL | INTRAVENOUS | Status: DC | PRN

## 2011-03-12 MED ORDER — MORPHINE SULFATE 2 MG/ML IJ SOLN
1.0000 mg | INTRAMUSCULAR | Status: DC | PRN
  Administered 2011-03-12: 1 mg via INTRAVENOUS
  Filled 2011-03-12: qty 1

## 2011-03-12 MED ORDER — NITROGLYCERIN 0.2 MG/ML ON CALL CATH LAB
INTRAVENOUS | Status: AC
  Filled 2011-03-12: qty 1

## 2011-03-12 MED ORDER — HEPARIN BOLUS VIA INFUSION
1500.0000 [IU] | Freq: Once | INTRAVENOUS | Status: AC
  Administered 2011-03-12: 1500 [IU] via INTRAVENOUS
  Filled 2011-03-12: qty 1500

## 2011-03-12 MED ORDER — SODIUM CHLORIDE 0.9 % IJ SOLN: 3.0000 mL | INTRAMUSCULAR | Status: DC | PRN

## 2011-03-12 MED ORDER — SODIUM CHLORIDE 0.9 % IJ SOLN
3.0000 mL | Freq: Two times a day (BID) | INTRAMUSCULAR | Status: DC
  Administered 2011-03-12: 3 mL via INTRAVENOUS

## 2011-03-12 MED ORDER — VERAPAMIL HCL 2.5 MG/ML IV SOLN
INTRAVENOUS | Status: AC
  Filled 2011-03-12: qty 2

## 2011-03-12 MED ORDER — SODIUM CHLORIDE 0.9 % IV SOLN
INTRAVENOUS | Status: AC
  Administered 2011-03-12: 02:00:00 via INTRAVENOUS

## 2011-03-12 MED ORDER — ASPIRIN 81 MG PO CHEW
324.0000 mg | CHEWABLE_TABLET | Freq: Once | ORAL | Status: AC
  Administered 2011-03-12: 324 mg via ORAL

## 2011-03-12 MED ORDER — LIDOCAINE HCL (PF) 1 % IJ SOLN
INTRAMUSCULAR | Status: AC
  Filled 2011-03-12: qty 30

## 2011-03-12 MED ORDER — HEPARIN (PORCINE) IN NACL 2-0.9 UNIT/ML-% IJ SOLN
INTRAMUSCULAR | Status: AC
  Filled 2011-03-12: qty 2000

## 2011-03-12 NOTE — Progress Notes (Signed)
CASE MANAGEMENT MADE AWARE ABOUT THE PT'S REQUEST  TO TALK TO SOMEONE REGARDING HER HOSP. BILLS.

## 2011-03-12 NOTE — Progress Notes (Signed)
Pt in cath lab now. Will revisit.

## 2011-03-12 NOTE — H&P (Signed)
Haley Turner, Haley Turner              ACCOUNT NO.:  [id number]  MEDICAL RECORD NO.:  [id number]  LOCATION:  2918                         FACILITY:  [facility]  PHYSICIAN:  Natasha Bence, MD       DATE OF BIRTH:  1960-04-24  DATE OF ADMISSION:  03/11/2011 DATE OF DISCHARGE:  03/11/2011                             HISTORY & PHYSICAL   CHIEF COMPLAINT:  Chest discomfort.  HISTORY OF PRESENT ILLNESS:  Haley Turner is a 51 year old female with a history of dyslipidemia who presented to the emergency department with a chief complaint of chest heaviness.  She reports she was in church service earlier today and undergoing worship which she describes as a very vigorous physical activity.  She experienced chest tightness that radiated across her chest from arm to arm, did not radiate to her back. It was not associated with any shortness of breath, was associated with mild nausea and diaphoresis.  The pain lasted approximately 10 minutes and then subsided after she sat down and rested.  Currently, she is pain free.  She has had similar pain approximately 1 year ago but did not seek medical care at that point in time.  Otherwise, she reports being somewhat active and not having any recent chest discomfort or shortness of breath.  She denies dyspnea on exertion, PND, or orthopnea.  She has not had any problem with palpitations, lightheadedness or syncope.  She denies any melena or hematochezia.  REVIEW OF SYSTEMS:  Twelve point review of systems was performed and was negative except what was stated.  She denies any fevers, chills, weight loss.  No bleeding or melena.  She denies any significant arthralgias. No GU complaints.  No skin complaints.  PAST MEDICAL HISTORY:  Dyslipidemia.  FAMILY HISTORY:  Mother had an MI sometime in her 71s.  Her father had a stroke, and her brother had a stroke.  SOCIAL HISTORY:  She is a smoker, currently smokes about a half pack per day at least since her teens.  She  drinks alcohol on occasion.  No illicit drug use.  She works for a [employer].  ALLERGIES:  She is allergic to sulfa.  MEDICATIONS:  She reports no home medications that she takes routinely.  PHYSICAL EXAMINATION:  VITAL SIGNS:  She is afebrile.  Temperature 97.7, pulse of 85 and regular, respiratory rate of 20, blood pressure of 121/62, O2 sats 98% on room air. GENERAL:  She is a well-developed white female in no apparent distress. EYES:  She has an anicteric sclerae. NECK:  She has normal jugular venous pressure.  No carotid bruits. LUNGS:  Clear to auscultation bilaterally. CARDIOVASCULAR:  Regular rate and rhythm.  No murmurs, rubs, or gallops. ABDOMEN:  Soft, nontender, nondistended. CHEST:  Nontender. EXTREMITIES:  She has symmetrical pulses throughout.  She has warm extremities with no edema. NEUROLOGIC:  Grossly afocal. PSYCHIATRIC:  Within normal limits. HEENT:  Mucous membranes are moist. SKIN:  No rash or ulcers.  LABORATORY DATA:  Sodium 140, potassium 3.6, chloride 107, bicarb 25, BUN of 10, creatinine of 0.6, calcium of 9.6, glucose of 88.  Initial troponin was less than 0.3, repeat was 0.58.  White blood cell count  5.9 with a normal differential, hematocrit of 40.6, platelet count 231. Chest x-ray showed no acute infiltrates or effusions.  EKG shows sinus mechanism, rate of 57 beats per minute.  No acute ST changes suggestive of ischemia.  IMPRESSION AND PLAN:  This is a 51 year old female with past medical history of dyslipidemia who presents with chest heaviness concerning for angina.  She has mildly elevated cardiac enzymes consistent with non-ST elevation myocardial infarction.  At this point, she is pain free and hemodynamically stable.  We will monitor her on telemetry with serial cardiac enzymes.  We will make her n.p.o. for likely heart catheterization in the morning.  She has already received aspirin in the emergency department and we will continue this.   She was also already started on heparin infusion.  We will also add Integrilin infusion in preparation for cath tomorrow.  We will also add metoprolol 25 mg p.o. b.i.d. and Crestor.  We will obtain a fasting lipid panel in the morning.          ______________________________ Natasha Bence, MD     MH/MEDQ  D:  03/11/2011  T:  03/12/2011  Job:  [id number]

## 2011-03-12 NOTE — Progress Notes (Signed)
Subjective:  Feeling better, no CP, no SOB. Reviewed H&P.   Objective:  Vital Signs in the last 24 hours: Temp:  [97.7 F (36.5 C)-98.3 F (36.8 C)] 97.7 F (36.5 C) (02/11 0745) Pulse Rate:  [61-97] 61  (02/11 0745) Resp:  [14-22] 19  (02/11 0745) BP: (97-159)/(47-102) 106/69 mmHg (02/11 0745) SpO2:  [97 %-100 %] 100 % (02/11 0745) Weight:  [67.9 kg (149 lb 11.1 oz)-72.576 kg (160 lb)] 67.9 kg (149 lb 11.1 oz) (02/10 2147)  Intake/Output from previous day: 02/10 0701 - 02/11 0700 In: 477.8 [P.O.:300; I.V.:177.8] Out: -    Physical Exam: General: Well developed, well nourished, in no acute distress. Head:  Normocephalic and atraumatic. Lungs: Clear to auscultation and percussion. Heart: Normal S1 and S2.  No murmur, rubs or gallops.  Pulses: Pulses normal in all 4 extremities. Abdomen: soft, non-tender, positive bowel sounds. Extremities: No clubbing or cyanosis. No edema. Neurologic: Alert and oriented x 3.    Lab Results:  Basename 03/12/11 0325 03/11/11 1359  WBC 5.0 5.9  HGB 12.2 13.2  PLT 219 231    Basename 03/12/11 0319 03/11/11 1359  NA 138 140  K 3.6 3.6  CL 107 107  CO2 25 25  GLUCOSE 89 88  BUN 17 10  CREATININE 0.71 0.64    Basename 03/12/11 0319 03/11/11 2214  TROPONINI 1.43* 2.35*   Hepatic Function Panel   Basename 03/12/11 0319  CHOL 161     Imaging: Dg Chest 2 View  03/11/2011  *RADIOLOGY REPORT*  Clinical Data: Chest pain  CHEST - 2 VIEW  Comparison:  09/23/2009  Findings: Cardiomediastinal silhouette is stable.  No acute infiltrate or pleural effusion.  No pulmonary edema.  Bony thorax is stable.  IMPRESSION: No active disease.  No significant change.  Original Report Authenticated By: Natasha Mead, M.D.   Personally viewed.   Telemetry: 3 beats of VT Personally viewed.   EKG:  Diffuse mild ST elevation ?pericarditis. No Q.   Assessment/Plan:  Principal Problem:  *NSTEMI (non-ST elevated myocardial infarction) Active  Problems:  HYPERLIPIDEMIA, MILD Mother MI in 55's SMOKER  - Plan for cath. Risk and benefits discussed. Haley Turner) -D/C integrillin. ?Myopericarditis with ECG changes.  -Tobacco cessation -Cont Hep, Bb, Statin, ASA -3 beats VT - Bb  Discussed with Dr. Eldridge Turner.   Turner, Haley 03/12/2011, 8:44 AM

## 2011-03-12 NOTE — Op Note (Signed)
PROCEDURE:  Left heart catheterization with selective coronary angiography, left ventriculogram.  INDICATIONS:    The risks, benefits, and details of the procedure were explained to the patient.  The patient verbalized understanding and wanted to proceed.  Informed written consent was obtained.  PROCEDURE TECHNIQUE:  After Xylocaine anesthesia a 73F sheath was placed in the right radial artery with a single anterior needle wall stick.   Left coronary angiography was done using a EBU 3.0 guide catheter.  Right coronary angiography was done using a Judkins R4 guide catheter.  Left ventriculography was done using a pigtail catheter.    CONTRAST:  Total of 60 cc.  COMPLICATIONS:  None.    HEMODYNAMICS:  Aortic pressure was 101/60; LV pressure was 101/10; LVEDP 17.  There was no gradient between the left ventricle and aorta.    ANGIOGRAPHIC DATA:   The left main coronary artery is widely patent.  There is mild atherosclerosis.  The left anterior descending artery is a large vessel proximally.  There is mild atherosclerosis noted in the mid vessel.  The distal vessel is small.  The first diagonal is large and widely patent.  The second diagonal is medium in size and widely patent..  The left circumflex artery is a medium-size vessel.  Proximally, there is mild atherosclerosis.  The OM1 is large and appears angiographic normal.  OM 2 is medium-sized and is widely patent.  There is a small OM 3 which is widely patent.  There is a small ramus vessel which is patent..  The right coronary artery is a large dominant vessel.  The right coronary artery appears angiographically normal.  The PDA is a large vessel.  The posterior lateral artery is quite small.  LEFT VENTRICULOGRAM:  Left ventricular angiogram was done in the 30 RAO projection and revealed normal left ventricular wall motion and systolic function with an estimated ejection fraction of 60 %.  LVEDP was 17 mmHg.  IMPRESSIONS:  1. Normal left  main coronary artery. 2. Mild atherosclerosis in the LAD and circumflex. 3. Normal right coronary artery. 4. Normal left ventricular systolic function.  LVEDP 17 mmHg.  Ejection fraction 60 %.  RECOMMENDATION:  The patient continue aggressive preventive therapy.  She needs to stop smoking.  She needs regular exercise and healthy diet as well.Marland Kitchen

## 2011-03-12 NOTE — Progress Notes (Signed)
TO CATH. LAB BY BED, STABLE, REPORT GIVEN TO STAFF.

## 2011-03-12 NOTE — Progress Notes (Signed)
ANTICOAGULATION CONSULT NOTE - Initial Consult  Pharmacy Consult for Heparin/Integrilin Indication: chest pain/ACS  Allergies  Allergen Reactions  . Sulfonamide Derivatives     REACTION: Pruritic rash    Patient Measurements: Height: 5\' 3"  (160 cm) Weight: 149 lb 11.1 oz (67.9 kg) IBW/kg (Calculated) : 52.4  Heparin Dosing Weight: 68 kg  Vital Signs: Temp: 98.2 F (36.8 C) (02/11 0328) Temp src: Oral (02/11 0328) BP: 103/63 mmHg (02/11 0330) Pulse Rate: 77  (02/11 0049)  Labs:  Basename 03/12/11 0325 03/12/11 0319 03/11/11 2214 03/11/11 1645 03/11/11 1359  HGB 12.2 -- -- -- 13.2  HCT 36.6 -- -- -- 40.6  PLT 219 -- -- -- 231  APTT -- -- -- -- --  LABPROT -- -- 13.6 -- --  INR -- -- 1.02 -- --  HEPARINUNFRC -- 0.19* -- -- --  CREATININE -- -- -- -- 0.64  CKTOTAL -- -- 255* -- --  CKMB -- -- 8.3* -- --  TROPONINI -- -- 2.35* 0.58* <0.30   Estimated Creatinine Clearance: 77.8 ml/min (by C-G formula based on Cr of 0.64).  Assessment: 51 y.o. Female with NSTEMI for anticoagulation  Goal of Therapy:  Heparin level 0.3-0.5 while on Integrilin  Plan:  1. Heparin bolus of 1500 units x 1 2. Increase heparin 1050 units/hr  3.F/U after cath   Geannie Risen, PharmD, BCPS  03/12/2011 5:09 AM

## 2011-03-12 NOTE — Progress Notes (Signed)
UR Completed. Simmons, Delmi Fulfer F [phone]  

## 2011-03-12 NOTE — Progress Notes (Signed)
NOT COMING BACK IN 2900, BELONGINGS BROUGHT BY NA TO THE CATH LAB AND ENDORSED TO THE PT.

## 2011-03-13 DIAGNOSIS — I309 Acute pericarditis, unspecified: Secondary | ICD-10-CM | POA: Diagnosis present

## 2011-03-13 LAB — CBC
HCT: 37.3 % (ref 36.0–46.0)
Hemoglobin: 12.2 g/dL (ref 12.0–15.0)
RBC: 4.35 MIL/uL (ref 3.87–5.11)
WBC: 4.4 10*3/uL (ref 4.0–10.5)

## 2011-03-13 MED ORDER — SIMVASTATIN 20 MG PO TABS: 20.0000 mg | ORAL_TABLET | Freq: Every evening | ORAL | Status: DC

## 2011-03-13 MED ORDER — IBUPROFEN 800 MG PO TABS
800.0000 mg | ORAL_TABLET | Freq: Three times a day (TID) | ORAL | Status: DC
  Filled 2011-03-13 (×3): qty 1

## 2011-03-13 MED ORDER — IBUPROFEN 800 MG PO TABS: 800.0000 mg | ORAL_TABLET | Freq: Three times a day (TID) | ORAL | Status: AC

## 2011-03-13 MED ORDER — METOPROLOL TARTRATE 25 MG PO TABS: 25.0000 mg | ORAL_TABLET | Freq: Two times a day (BID) | ORAL | Status: DC

## 2011-03-13 MED ORDER — ASPIRIN 81 MG PO TBEC: 81.0000 mg | DELAYED_RELEASE_TABLET | Freq: Every day | ORAL | Status: AC

## 2011-03-13 NOTE — Discharge Summary (Signed)
Patient ID: Haley Turner MRN: [id number] DOB/AGE: [birth year] 50 y.o.  Admit date: 03/11/2011 Discharge date: 03/13/2011  Primary Discharge Diagnosis: myopericarditis with elevated cardiac biomarkers, normal ejection fraction  Secondary Discharge Diagnosis: Tobacco use, hyperlipidemia.  Significant Diagnostic Studies:  Cardiac catheterization: LEFT VENTRICULOGRAM: Left ventricular angiogram was done in the 30 RAO projection and revealed normal left ventricular wall motion and systolic function with an estimated ejection fraction of 60 %. LVEDP was 17 mmHg.  IMPRESSIONS:  1. Normal left main coronary artery. 2. Mild atherosclerosis in the LAD and circumflex. 3. Normal right coronary artery. 4. Normal left ventricular systolic function. LVEDP 17 mmHg. Ejection fraction 60 %. RECOMMENDATION: The patient continue aggressive preventive therapy. She needs to stop smoking. She needs regular exercise and healthy diet as well.Marland Kitchen   Hospital Course: 51 year old with hyperlipidemia, tobacco use who was admitted with chest discomfort. She was in church service where she describes very physical activity an experience chest tightness that radiated across her chest and her arm. It lasted about 10 minutes then subsided. She has similar pain one year ago. No dyspnea. Currently feeling very well. Her cardiac markers were elevated. Peak CK was 255, peak MB was 8.3, peak troponin was 2.35.TSH was normal, hemoglobin 13.2, electrolytes normal. Chest x-ray unremarkable. She underwent radial artery left heart catheterization and showed no evidence of coronary artery disease. Normal left ventricular ejection fraction. Reassuring. Her EKG does show subtle diffuse ST segment elevation of approximately 0.5-1 mm consistent with myopericarditis. She was given ibuprofen on morning of discharge. She feels much better. She is back in her usual state of health she states. Radial artery site is normal. She is ready for  discharge. We discussed at length tobacco cessation. We will continue with statin therapy. We'll see her in close followup.  Discharge Exam: Blood pressure 117/71, pulse 67, temperature 97.7 F (36.5 C), temperature source Oral, resp. rate 20, height 5\' 3"  (1.6 m), weight 67.9 kg (149 lb 11.1 oz), SpO2 100.00%.    General: Alert and oriented x3 no acute distress Cardiovascular: Irregular rate and rhythm without any rubs, no murmurs. Lungs: No rubs, normal respiratory effort, no wheezes Abdomen: Soft nontender normal active bowel sounds. Extremities: Radial artery site 2+ pulses, no edema. No rashes.  Labs:   Lab Results  Component Value Date   WBC 4.4 03/13/2011   HGB 12.2 03/13/2011   HCT 37.3 03/13/2011   MCV 85.7 03/13/2011   PLT 208 03/13/2011    Lab 03/12/11 0319  NA 138  K 3.6  CL 107  CO2 25  BUN 17  CREATININE 0.71  CALCIUM 9.0  PROT --  BILITOT --  ALKPHOS --  ALT --  AST --  GLUCOSE 89   Lab Results  Component Value Date   CKTOTAL 215* 03/12/2011   CKMB 6.1* 03/12/2011   TROPONINI 1.29* 03/12/2011    Lab Results  Component Value Date   CHOL 161 03/12/2011   CHOL 228* 09/26/2009   Lab Results  Component Value Date   HDL 42 03/12/2011   HDL 51 05/07/8117   Lab Results  Component Value Date   LDLCALC 103* 03/12/2011   LDLCALC 158* 09/26/2009   Lab Results  Component Value Date   TRIG 79 03/12/2011   TRIG 95 09/26/2009   Lab Results  Component Value Date   CHOLHDL 3.8 03/12/2011   CHOLHDL 4.5 Ratio 09/26/2009   No results found for this basename: LDLDIRECT      FOLLOW UP PLANS AND APPOINTMENTS  Medication List    Notice       You have not been prescribed any medications.              BRING ALL MEDICATIONS WITH YOU TO FOLLOW UP APPOINTMENTS  Time spent with patient to include physician time: Spent with patient, med reconciliation, appointment.  SignedDonato Schultz 03/13/2011, 9:39 AM

## 2011-03-13 NOTE — Progress Notes (Signed)
Pt smokes 1/2 ppd + and is very eager to quit and in action stage. She has only used the e-cigarette before unsuccessfully to quit. Recommended for pt to use the patches to help her quit. 21 mg patch x 6 weeks, 14 mg patch x 2 weeks, 7 mg patch x 2 weeks. Discussed patch use instructions and wrote them down for her. Referred to 1-800 quit now for f/u and support. Discussed oral fixation substitutes, second hand smoke and in home smoking policy. Reviewed and gave pt Written education/contact information.

## 2012-05-23 ENCOUNTER — Emergency Department (HOSPITAL_COMMUNITY)
Admission: EM | Admit: 2012-05-23 | Discharge: 2012-05-23 | Disposition: A | Discharge status: Home / Self Care | Payer: Self-pay | Attending: Emergency Medicine | Admitting: Emergency Medicine

## 2012-05-23 ENCOUNTER — Encounter (HOSPITAL_COMMUNITY): Payer: Self-pay | Admitting: *Deleted

## 2012-05-23 DIAGNOSIS — I252 Old myocardial infarction: Secondary | ICD-10-CM | POA: Insufficient documentation

## 2012-05-23 DIAGNOSIS — I2510 Atherosclerotic heart disease of native coronary artery without angina pectoris: Secondary | ICD-10-CM | POA: Insufficient documentation

## 2012-05-23 DIAGNOSIS — Y9389 Activity, other specified: Secondary | ICD-10-CM | POA: Insufficient documentation

## 2012-05-23 DIAGNOSIS — W57XXXA Bitten or stung by nonvenomous insect and other nonvenomous arthropods, initial encounter: Secondary | ICD-10-CM

## 2012-05-23 DIAGNOSIS — Z7982 Long term (current) use of aspirin: Secondary | ICD-10-CM | POA: Insufficient documentation

## 2012-05-23 DIAGNOSIS — Y92009 Unspecified place in unspecified non-institutional (private) residence as the place of occurrence of the external cause: Secondary | ICD-10-CM | POA: Insufficient documentation

## 2012-05-23 DIAGNOSIS — F17200 Nicotine dependence, unspecified, uncomplicated: Secondary | ICD-10-CM | POA: Insufficient documentation

## 2012-05-23 DIAGNOSIS — Z79899 Other long term (current) drug therapy: Secondary | ICD-10-CM | POA: Insufficient documentation

## 2012-05-23 DIAGNOSIS — IMO0001 Reserved for inherently not codable concepts without codable children: Secondary | ICD-10-CM | POA: Insufficient documentation

## 2012-05-23 MED ORDER — KETOROLAC TROMETHAMINE 30 MG/ML IJ SOLN
30.0000 mg | Freq: Once | INTRAMUSCULAR | Status: AC
  Administered 2012-05-23: 30 mg via INTRAVENOUS
  Filled 2012-05-23: qty 1

## 2012-05-23 MED ORDER — FAMOTIDINE IN NACL 20-0.9 MG/50ML-% IV SOLN
20.0000 mg | Freq: Once | INTRAVENOUS | Status: AC
  Administered 2012-05-23: 20 mg via INTRAVENOUS
  Filled 2012-05-23: qty 50

## 2012-05-23 MED ORDER — SODIUM CHLORIDE 0.9 % IV BOLUS (SEPSIS)
1000.0000 mL | Freq: Once | INTRAVENOUS | Status: AC
  Administered 2012-05-23: 1000 mL via INTRAVENOUS

## 2012-05-23 MED ORDER — DEXAMETHASONE SODIUM PHOSPHATE 10 MG/ML IJ SOLN
10.0000 mg | Freq: Once | INTRAMUSCULAR | Status: AC
  Administered 2012-05-23: 10 mg via INTRAVENOUS
  Filled 2012-05-23: qty 1

## 2012-05-23 MED ORDER — DIPHENHYDRAMINE HCL 50 MG/ML IJ SOLN
50.0000 mg | Freq: Once | INTRAMUSCULAR | Status: AC
  Administered 2012-05-23: 50 mg via INTRAVENOUS
  Filled 2012-05-23: qty 1

## 2012-05-23 NOTE — ED Provider Notes (Signed)
Medical screening examination/treatment/procedure(s) were performed by non-physician practitioner and as supervising physician I was immediately available for consultation/collaboration.    Areya Lemmerman D Charnese Federici, MD 05/23/12 2139 

## 2012-05-23 NOTE — ED Provider Notes (Signed)
History     CSN: [id number]  Arrival date & time 05/23/12  0459   First MD Initiated Contact with Patient 05/23/12 [phone]      Chief Complaint  Patient presents with  . Insect Bite    (Consider location/radiation/quality/duration/timing/severity/associated sxs/prior treatment) HPI  52 year old female with past medical history of previous MI and known coronary artery disease presents the emergency department chief complaint of bug bite.  Patient states that she was awoken from sleep yesterday morning with pain in the left arm.  She noticed a bite and has had progressive swelling heat and redness in the arm along with associated pain.  The patient has applied topical Benadryl without relief of her symptoms.  Yesterday during her work she had headache and dizziness all along with a small amount of nausea.  She denies any vomiting or diarrhea.  Patient denies any symptoms of stroke or TIA.  The patient lives at home with her daughter.  She denies any other bites and her daughter does not have any known bites.  She denies any numbness or tingling in the fingers.  She does have some pain denies loss of grip strength, tingling, pallor of the hand.  She did not see what bit her.  She denies any fevers, chills, vomiting myalgias or arthralgias.  Past Medical History  Diagnosis Date  . No pertinent past medical history   . Coronary artery disease   . MI, old     Past Surgical History  Procedure Laterality Date  . No past surgeries      No family history on file.  History  Substance Use Topics  . Smoking status: Current Every Day Smoker -- 0.50 packs/day    Types: Cigarettes  . Smokeless tobacco: Not on file  . Alcohol Use: No    OB History   Grav Para Term Preterm Abortions TAB SAB Ect Mult Living                  Review of Systems Ten systems reviewed and are negative for acute change, except as noted in the HPI.   Allergies  Sulfonamide derivatives  Home Medications    Current Outpatient Rx  Name  Route  Sig  Dispense  Refill  . aspirin 81 MG chewable tablet   Oral   Chew 81 mg by mouth daily.         . metoprolol tartrate (LOPRESSOR) 25 MG tablet   Oral   Take 1 tablet (25 mg total) by mouth 2 (two) times daily.   60 tablet   12   . simvastatin (ZOCOR) 20 MG tablet   Oral   Take 1 tablet (20 mg total) by mouth every evening.   30 tablet   12     BP 144/76  Pulse 81  Temp(Src) 98.7 F (37.1 C) (Oral)  Resp 16  SpO2 100%  Physical Exam  Constitutional: She is oriented to person, place, and time. She appears well-developed and well-nourished. No distress.  HENT:  Head: Normocephalic and atraumatic.  Eyes: Conjunctivae are normal. No scleral icterus.  Neck: Normal range of motion.  Cardiovascular: Normal rate, regular rhythm and normal heart sounds.  Exam reveals no gallop and no friction rub.   No murmur heard. Pulmonary/Chest: Effort normal and breath sounds normal. No respiratory distress.  Abdominal: Soft. Bowel sounds are normal. She exhibits no distension and no mass. There is no tenderness. There is no guarding.  Musculoskeletal:  Small wheal with 2 visible fang  marks centrally on the dorsal surface of the left forearm there is a 3 x 2 cm surrounding area of swelling and local fever.  She has heat redness swelling and tenderness throughout the forearm and extending up to the medial surface of the arm.  Discharge, purulent drainage or bleeding noted.  No lymphangitis.  Neurological: She is alert and oriented to person, place, and time.  Skin: Skin is warm and dry. She is not diaphoretic.    ED Course  Procedures (including critical care time)  Labs Reviewed - No data to display No results found.   1. Insect bite       MDM  7:35 AM Filed Vitals:   05/23/12 0505  BP: 144/76  Pulse: 81  Temp: 98.7 F (37.1 C)  Resp: 16   Patient with bug bite on the left forearm.  Envenomation sites are visible.  There is no  sign of compartment syndrome or necrosis.  Patient is hot and swollen but does not appear cellulitic.  Going to treat the patient with Decadron, Benadryl, Pepcid fluids and Toradol for her headache.   8:48 AM BP 144/76  Pulse 81  Temp(Src) 98.7 F (37.1 C) (Oral)  Resp 16  SpO2 100% Patient has a marked reduction in swelling and redness.  She states that her pain is gone from the arm at this time.  Patient also states her headache is resolved.  She currently has a half liter of fluids (old but her finish her fluids eventually discharged to home.  She is up-to-date on her tetanus vaccination per medical records.       Arthor Captain, PA-C 05/23/12 [phone]

## 2012-05-23 NOTE — ED Notes (Signed)
The pt woke up  Yesterday am with the lt arm red and sl swollen with pain and itching.  Forearm red

## 2012-06-14 ENCOUNTER — Encounter (HOSPITAL_COMMUNITY): Payer: Self-pay | Admitting: Nurse Practitioner

## 2012-06-14 ENCOUNTER — Encounter (HOSPITAL_COMMUNITY): Payer: Self-pay | Admitting: Emergency Medicine

## 2012-06-14 ENCOUNTER — Emergency Department (INDEPENDENT_AMBULATORY_CARE_PROVIDER_SITE_OTHER)
Admission: EM | Admit: 2012-06-14 | Discharge: 2012-06-14 | Disposition: A | Discharge status: Nursing Home (Includes ICF) | Payer: No Typology Code available for payment source | Source: Home / Self Care | Attending: Family Medicine | Admitting: Family Medicine

## 2012-06-14 ENCOUNTER — Emergency Department (HOSPITAL_COMMUNITY)
Admission: EM | Admit: 2012-06-14 | Discharge: 2012-06-14 | Disposition: A | Discharge status: Home / Self Care | Payer: No Typology Code available for payment source | Attending: Emergency Medicine | Admitting: Emergency Medicine

## 2012-06-14 ENCOUNTER — Other Ambulatory Visit: Payer: Self-pay

## 2012-06-14 ENCOUNTER — Emergency Department (HOSPITAL_COMMUNITY): Payer: No Typology Code available for payment source

## 2012-06-14 DIAGNOSIS — Z7982 Long term (current) use of aspirin: Secondary | ICD-10-CM | POA: Insufficient documentation

## 2012-06-14 DIAGNOSIS — E162 Hypoglycemia, unspecified: Secondary | ICD-10-CM

## 2012-06-14 DIAGNOSIS — Z79899 Other long term (current) drug therapy: Secondary | ICD-10-CM | POA: Insufficient documentation

## 2012-06-14 DIAGNOSIS — Z882 Allergy status to sulfonamides status: Secondary | ICD-10-CM | POA: Insufficient documentation

## 2012-06-14 DIAGNOSIS — R42 Dizziness and giddiness: Secondary | ICD-10-CM | POA: Insufficient documentation

## 2012-06-14 DIAGNOSIS — I252 Old myocardial infarction: Secondary | ICD-10-CM | POA: Insufficient documentation

## 2012-06-14 DIAGNOSIS — R079 Chest pain, unspecified: Secondary | ICD-10-CM | POA: Insufficient documentation

## 2012-06-14 DIAGNOSIS — I2510 Atherosclerotic heart disease of native coronary artery without angina pectoris: Secondary | ICD-10-CM | POA: Insufficient documentation

## 2012-06-14 DIAGNOSIS — R110 Nausea: Secondary | ICD-10-CM | POA: Insufficient documentation

## 2012-06-14 DIAGNOSIS — F17200 Nicotine dependence, unspecified, uncomplicated: Secondary | ICD-10-CM | POA: Insufficient documentation

## 2012-06-14 LAB — CBC WITH DIFFERENTIAL/PLATELET
Eosinophils Absolute: 0.1 10*3/uL (ref 0.0–0.7)
HCT: 43 % (ref 36.0–46.0)
Hemoglobin: 14.3 g/dL (ref 12.0–15.0)
Lymphs Abs: 2.8 10*3/uL (ref 0.7–4.0)
MCH: 28.5 pg (ref 26.0–34.0)
MCHC: 33.3 g/dL (ref 30.0–36.0)
MCV: 85.8 fL (ref 78.0–100.0)
Monocytes Absolute: 0.7 10*3/uL (ref 0.1–1.0)
Monocytes Relative: 10 % (ref 3–12)
Neutrophils Relative %: 47 % (ref 43–77)
RBC: 5.01 MIL/uL (ref 3.87–5.11)

## 2012-06-14 LAB — BASIC METABOLIC PANEL
BUN: 13 mg/dL (ref 6–23)
CO2: 28 mEq/L (ref 19–32)
Chloride: 105 mEq/L (ref 96–112)
GFR calc non Af Amer: >90 mL/min (ref >90)
Glucose, Bld: 77 mg/dL (ref 70–99)
Potassium: 3.9 mEq/L (ref 3.5–5.1)
Sodium: 144 mEq/L (ref 135–145)

## 2012-06-14 LAB — POCT I-STAT, CHEM 8
BUN: 13 mg/dL (ref 6–23)
Chloride: 107 mEq/L (ref 96–112)
Creatinine, Ser: 0.8 mg/dL (ref 0.50–1.10)
Potassium: 4 mEq/L (ref 3.5–5.1)
Sodium: 143 mEq/L (ref 135–145)

## 2012-06-14 MED ORDER — ASPIRIN 81 MG PO CHEW
324.0000 mg | CHEWABLE_TABLET | Freq: Once | ORAL | Status: AC
  Administered 2012-06-14: 324 mg via ORAL
  Filled 2012-06-14: qty 4

## 2012-06-14 NOTE — ED Provider Notes (Signed)
History     CSN: [id number]  Arrival date & time 06/14/12  1217   First MD Initiated Contact with Patient 06/14/12 1421      Chief Complaint  Patient presents with  . Dizziness    dizzy and nausea about half an hour ago. sharp chest pain/left arm pain that comes and goes.   . Nausea    HPI: Patient is a 52 y.o. female presenting with chest pain. The history is provided by the patient.  Chest Pain Pain location:  Substernal area Pain quality: sharp   Pain radiates to:  L arm Pain radiates to the back: no   Pain severity:  Severe Onset quality:  Gradual Duration:  4 hours Timing:  Intermittent Progression:  Unchanged Chronicity:  Recurrent Worsened by:  Nothing tried Associated symptoms: dizziness, fatigue and nausea   Associated symptoms: no abdominal pain, no AICD problem, no anxiety, no back pain, no numbness, no shortness of breath, no syncope and not vomiting   Pt w/ report of dizziness and nausea since approx 1130 that has been associated w/ intermittent sharp CP and intermittent (L) arm pain. States she felt normal when she woke up today. Admits to intermittent CP frequently but (L) arm pain is new. States her cardiologist has told her the frequent CP is likely muscular. Pt reports MI last February and states symptoms not like symptoms with her MI. Pt thought at first it might be hunger so she ate but did not improve. Denies vomiting, syncope or SOB.   Past Medical History  Diagnosis Date  . No pertinent past medical history   . Coronary artery disease   . MI, old     Past Surgical History  Procedure Laterality Date  . No past surgeries      History reviewed. No pertinent family history.  History  Substance Use Topics  . Smoking status: Current Every Day Smoker -- 0.50 packs/day    Types: Cigarettes  . Smokeless tobacco: Not on file  . Alcohol Use: No    OB History   Grav Para Term Preterm Abortions TAB SAB Ect Mult Living                  Review of  Systems  Constitutional: Positive for fatigue.  HENT: Negative.  Negative for neck pain.   Eyes: Negative.   Respiratory: Negative for shortness of breath.   Cardiovascular: Positive for chest pain. Negative for syncope.  Gastrointestinal: Positive for nausea. Negative for vomiting and abdominal pain.  Endocrine: Negative.   Genitourinary: Negative.   Musculoskeletal: Negative.  Negative for back pain.  Skin: Negative.   Allergic/Immunologic: Negative.   Neurological: Positive for dizziness. Negative for numbness.  Hematological: Negative.   Psychiatric/Behavioral: Negative.     Allergies  Sulfonamide derivatives  Home Medications   Current Outpatient Rx  Name  Route  Sig  Dispense  Refill  . aspirin 81 MG chewable tablet   Oral   Chew 81 mg by mouth daily.         Marland Kitchen EXPIRED: metoprolol tartrate (LOPRESSOR) 25 MG tablet   Oral   Take 1 tablet (25 mg total) by mouth 2 (two) times daily.   60 tablet   12   . EXPIRED: simvastatin (ZOCOR) 20 MG tablet   Oral   Take 1 tablet (20 mg total) by mouth every evening.   30 tablet   12     BP 155/81  Pulse 52  Temp(Src) 98.4 F (36.9 C) (  Oral)  SpO2 100%  Physical Exam  Constitutional: She is oriented to person, place, and time. She appears well-developed and well-nourished.  HENT:  Head: Normocephalic and atraumatic.  Eyes: Conjunctivae are normal.  Neck: Neck supple.  Cardiovascular: Normal rate and regular rhythm.   Pulmonary/Chest: Effort normal and breath sounds normal.  Musculoskeletal: Normal range of motion.  Neurological: She is alert and oriented to person, place, and time.  Skin: Skin is warm and dry.  Psychiatric: She has a normal mood and affect.    ED Course  Procedures (including critical care time)  Labs Reviewed  POCT I-STAT, CHEM 8 - Abnormal; Notable for the following:    Glucose, Bld 64 (*)    All other components within normal limits   No results found.   No diagnosis  found.    MDM  Pt w/ report of dizziness and nausea since approx 1130 that has been associated w/ intermittent sharp CP and intermittent (L) arm pain. Pt though at first it might be hunger so she ate but did not improve. SG here is 64 so may have been a component. EKG reveals SR w/ early repol. No ischemic changes. Pt had MI last February so feel most prudent to transfer to [facility] for further evaluation of symptoms. Discussed pt w/ Dr Artis Flock who has also reviewed EKG and is in agreement w/ plan.        Leanne Chang, NP 06/14/12 1452

## 2012-06-14 NOTE — ED Provider Notes (Signed)
History     CSN: [id number]  Arrival date & time 06/14/12  1447   First MD Initiated Contact with Patient 06/14/12 1454      Chief Complaint  Patient presents with  . Chest Pain    (Consider location/radiation/quality/duration/timing/severity/associated sxs/prior treatment) HPI.... fleeting chest pressure, dizzy this, nausea at work approximately 2 hours ago. Symptoms have abated. Status post cardiac catheterization on 03/11/2011 with results as follow:  Normal left main and right coronary artery. Mild atherosclerosis of the LAD and circumflex. Normal LV function.  No radiation of pain. Severity is mild to moderate. Nothing makes symptoms better or worse   Past Medical History  Diagnosis Date  . No pertinent past medical history   . Coronary artery disease   . MI, old     Past Surgical History  Procedure Laterality Date  . No past surgeries      No family history on file.  History  Substance Use Topics  . Smoking status: Current Every Day Smoker -- 0.50 packs/day    Types: Cigarettes  . Smokeless tobacco: Not on file  . Alcohol Use: No    OB History   Grav Para Term Preterm Abortions TAB SAB Ect Mult Living                  Review of Systems  All other systems reviewed and are negative.    Allergies  Sulfonamide derivatives  Home Medications   Current Outpatient Rx  Name  Route  Sig  Dispense  Refill  . aspirin 81 MG chewable tablet   Oral   Chew 81 mg by mouth daily.         Marland Kitchen EXPIRED: metoprolol tartrate (LOPRESSOR) 25 MG tablet   Oral   Take 1 tablet (25 mg total) by mouth 2 (two) times daily.   60 tablet   12   . EXPIRED: simvastatin (ZOCOR) 20 MG tablet   Oral   Take 1 tablet (20 mg total) by mouth every evening.   30 tablet   12     BP 150/96  Temp(Src) 98.5 F (36.9 C) (Oral)  Resp 16  SpO2 100%  Physical Exam  Nursing note and vitals reviewed. Constitutional: She is oriented to person, place, and time. She appears  well-developed and well-nourished.  HENT:  Head: Normocephalic and atraumatic.  Eyes: Conjunctivae and EOM are normal. Pupils are equal, round, and reactive to light.  Neck: Normal range of motion. Neck supple.  Cardiovascular: Normal rate, regular rhythm and normal heart sounds.   Pulmonary/Chest: Effort normal and breath sounds normal.  Abdominal: Soft. Bowel sounds are normal.  Musculoskeletal: Normal range of motion.  Neurological: She is alert and oriented to person, place, and time.  Skin: Skin is warm and dry.  Psychiatric: She has a normal mood and affect.    ED Course  Procedures (including critical care time)  Labs Reviewed  BASIC METABOLIC PANEL  CBC WITH DIFFERENTIAL  TROPONIN I  Dg Chest Portable 1 View  06/14/2012   *RADIOLOGY REPORT*  Clinical Data: Chest pain  PORTABLE CHEST - 1 VIEW  Comparison: 03/11/2011  Findings: Cardiac leads project over the chest.  Cardiomediastinal silhouette is stable within normal limits.  Pulmonary vascularity is normal.  There is mild peribronchial thickening.  No airspace disease, effusion, or pneumothorax.  IMPRESSION: Mild peribronchial thickening may be due to history smoking, asthma, or bronchitis.   Original Report Authenticated By: Britta Mccreedy, M.D.   No results found.  No diagnosis found.   Date: 06/14/2012  Rate: 60  Rhythm: normal sinus rhythm  QRS Axis: normal  Intervals: normal  ST/T Wave abnormalities: normal  Conduction Disutrbances: none  Narrative Interpretation: unremarkable      MDM  Patient's normal physical exam. Cardiac catheterization report reviewed from February 2014.  Patient has cardiology followup. She understands to return if worse        Donnetta Hutching, MD 06/14/12 1801

## 2012-06-14 NOTE — ED Notes (Signed)
Per ems pt U[facility] tx sts intermittent chest pressure.

## 2012-06-14 NOTE — ED Notes (Signed)
Pt c/o dizziness and nausea. Sharp chest pain/left arm pain that comes and goes.  Pt denies sob and any other symptoms. Marland Kitchen Hx of heart attack February 2013.  Pt is sitting up right no signs of distress.

## 2012-06-15 NOTE — ED Provider Notes (Signed)
Medical screening examination/treatment/procedure(s) were performed by resident physician or non-physician practitioner and as supervising physician I was immediately available for consultation/collaboration.   KINDL,JAMES DOUGLAS MD.   James D Kindl, MD 06/15/12 1716 

## 2012-12-02 ENCOUNTER — Encounter: Payer: Self-pay | Admitting: Cardiology

## 2012-12-04 ENCOUNTER — Ambulatory Visit: Payer: No Typology Code available for payment source | Admitting: Cardiology

## 2012-12-29 ENCOUNTER — Ambulatory Visit: Payer: No Typology Code available for payment source | Admitting: Cardiology

## 2013-01-23 ENCOUNTER — Encounter: Payer: Self-pay | Admitting: Cardiology

## 2013-03-19 IMAGING — CR DG CHEST 2V
1 series · 1 of 1 positions shown · non-contrast
Comparison: 09/23/2009

CLINICAL DATA: Chest pain

CHEST - 2 VIEW

[w chest lat]
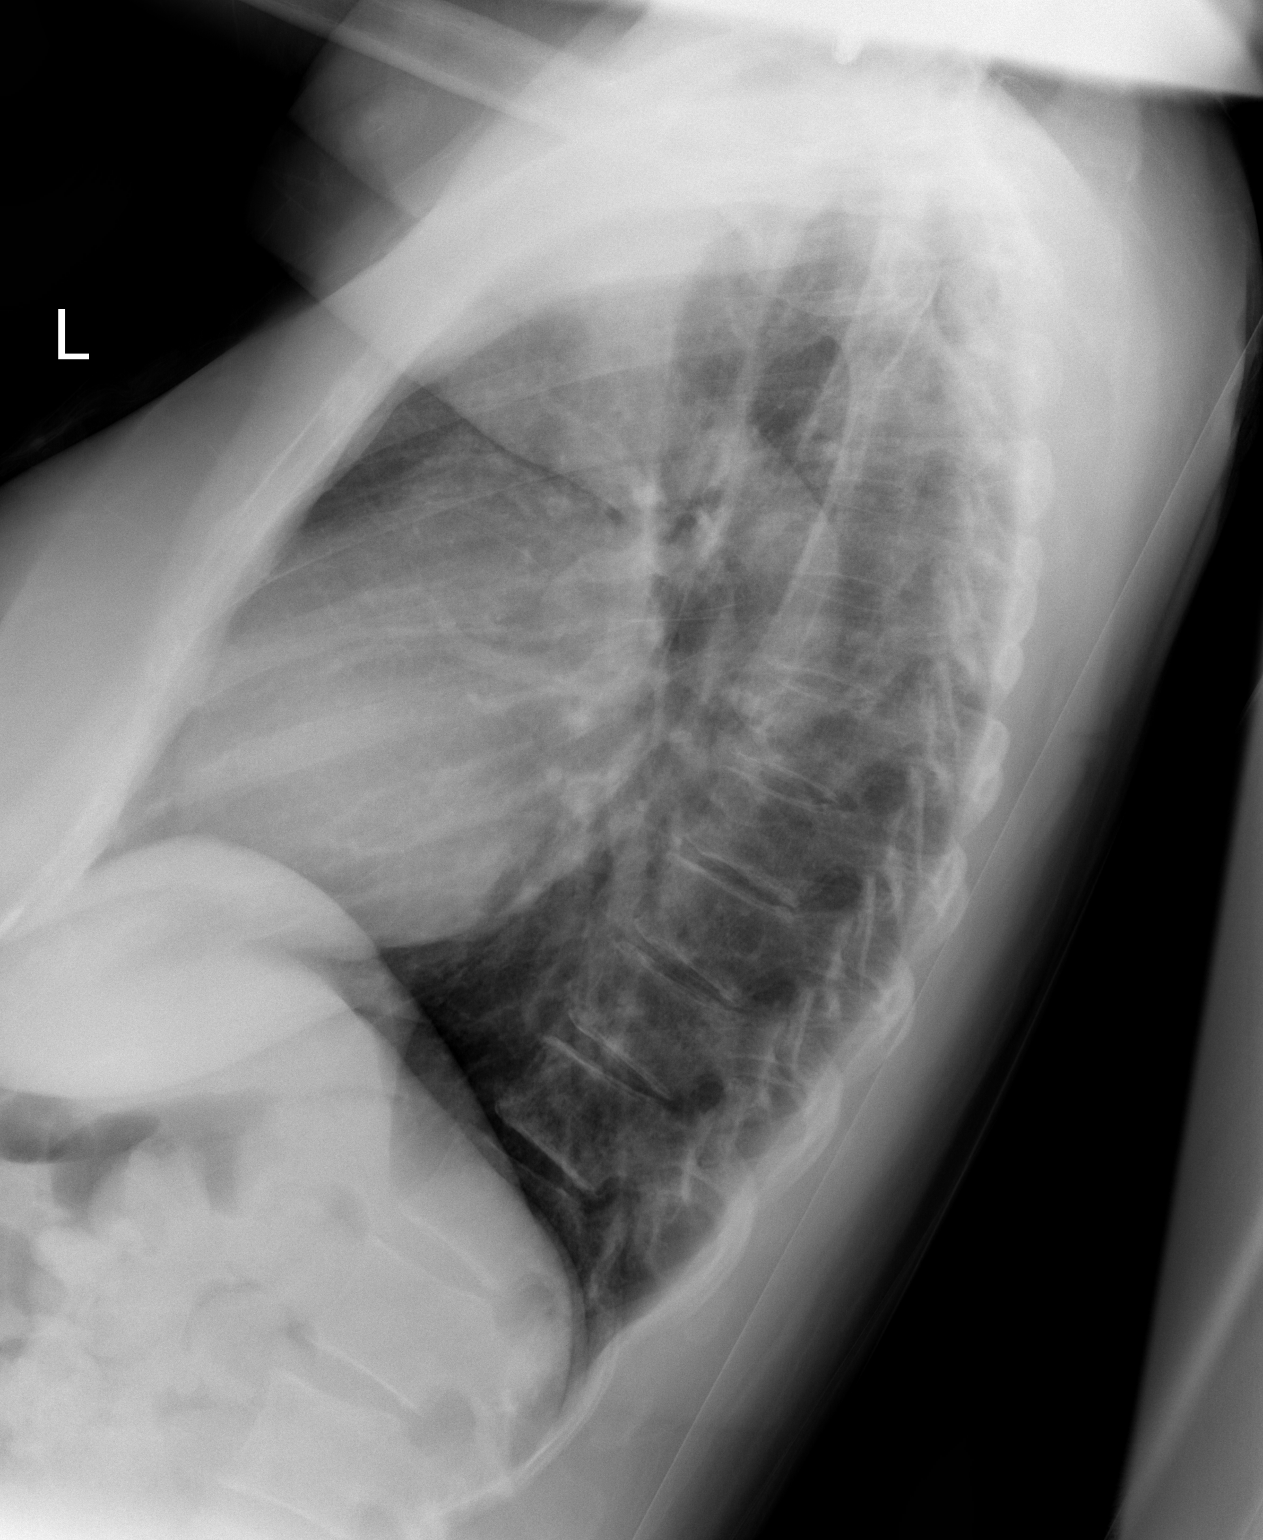

[1 of 1 positions shown; findings below may reference images not displayed]

FINDINGS: Cardiomediastinal silhouette is stable.  No acute
infiltrate or pleural effusion.  No pulmonary edema.  Bony thorax
is stable.
IMPRESSION: No active disease.  No significant change.

## 2013-09-24 ENCOUNTER — Encounter (HOSPITAL_COMMUNITY): Payer: Self-pay | Admitting: Emergency Medicine

## 2013-09-24 ENCOUNTER — Emergency Department (HOSPITAL_COMMUNITY)
Admission: EM | Admit: 2013-09-24 | Discharge: 2013-09-24 | Disposition: A | Discharge status: Home / Self Care | Payer: No Typology Code available for payment source | Attending: Emergency Medicine | Admitting: Emergency Medicine

## 2013-09-24 DIAGNOSIS — H00016 Hordeolum externum left eye, unspecified eyelid: Secondary | ICD-10-CM

## 2013-09-24 DIAGNOSIS — R220 Localized swelling, mass and lump, head: Secondary | ICD-10-CM | POA: Insufficient documentation

## 2013-09-24 DIAGNOSIS — Z9889 Other specified postprocedural states: Secondary | ICD-10-CM | POA: Insufficient documentation

## 2013-09-24 DIAGNOSIS — Z87891 Personal history of nicotine dependence: Secondary | ICD-10-CM | POA: Insufficient documentation

## 2013-09-24 DIAGNOSIS — H00019 Hordeolum externum unspecified eye, unspecified eyelid: Secondary | ICD-10-CM | POA: Insufficient documentation

## 2013-09-24 DIAGNOSIS — I2510 Atherosclerotic heart disease of native coronary artery without angina pectoris: Secondary | ICD-10-CM | POA: Insufficient documentation

## 2013-09-24 DIAGNOSIS — R221 Localized swelling, mass and lump, neck: Secondary | ICD-10-CM

## 2013-09-24 DIAGNOSIS — I252 Old myocardial infarction: Secondary | ICD-10-CM | POA: Insufficient documentation

## 2013-09-24 DIAGNOSIS — Z7982 Long term (current) use of aspirin: Secondary | ICD-10-CM | POA: Insufficient documentation

## 2013-09-24 MED ORDER — ERYTHROMYCIN 5 MG/GM OP OINT: TOPICAL_OINTMENT | OPHTHALMIC | Status: DC

## 2013-09-24 NOTE — ED Provider Notes (Signed)
CSN: [id number]     Arrival date & time 09/24/13  0450 History   First MD Initiated Contact with Patient 09/24/13 0606     Chief Complaint  Patient presents with  . Facial Swelling     (Consider location/radiation/quality/duration/timing/severity/associated sxs/prior Treatment) HPI  53 year old female with history of cardiac disease presents for evaluation of facial swelling and dental pain. Patient has a constellation of symptoms. 2 weeks ago she noticed that her left upper tooth was tender, pains but worsened with chewing and she experiencing some facial swelling. Her pain got better after 2-3 days without any specific treatment. A week ago she noticed swelling to the right upper eyelid which she thought was a stye. 2 use warm compress for several days and that resolved. 3 days ago she noticed swelling to her left eyelid with a sand sensation in her eye. Her swelling has gotten progressively worse. For the past 2 days she also notice crusting discharge to both eyes worse in the morning. She denies any vision change, double vision, severe itch, active dental pain, fever, headache, runny nose or sneezing. She also endorsed a cough productive with yellow phlegm for the past 2 week which has steadily improved. Denies any pleuritic chest pain, or shortness of breath. Denies any fever, or chills.  Pt is a nondiabetic.    Past Medical History  Diagnosis Date  . Coronary artery disease   . MI, old   . Acute myopericarditis    Past Surgical History  Procedure Laterality Date  . No past surgeries    . Cardiac catheterization     No family history on file. History  Substance Use Topics  . Smoking status: Former Smoker -- 0.50 packs/day    Types: Cigarettes    Quit date: 01/29/2012  . Smokeless tobacco: Not on file  . Alcohol Use: No   OB History   Grav Para Term Preterm Abortions TAB SAB Ect Mult Living                 Review of Systems  Constitutional: Negative for fever.  HENT:  Negative for facial swelling.   Eyes: Positive for discharge. Negative for photophobia, pain, redness, itching and visual disturbance.  Neurological: Negative for numbness.      Allergies  Sulfonamide derivatives  Home Medications   Prior to Admission medications   Medication Sig Start Date End Date Taking? Authorizing Provider  aspirin 81 MG chewable tablet Chew 81 mg by mouth daily.   Yes Historical Provider, MD  ibuprofen (ADVIL,MOTRIN) 200 MG tablet Take 200 mg by mouth every 6 (six) hours as needed for moderate pain.   Yes Historical Provider, MD   BP 140/69  Pulse 71  Temp(Src) 98.2 F (36.8 C) (Oral)  Resp 14  Ht  (1.6 m)  Wt 135 lb (61.236 kg)  BMI 23.92 kg/m2  SpO2 100% Physical Exam  Nursing note and vitals reviewed. Constitutional: She appears well-developed and well-nourished. No distress.  HENT:  Head: Atraumatic.  Mouth/Throat: Oropharynx is clear and moist.  Eyes: Conjunctivae and EOM are normal. Pupils are equal, round, and reactive to light. Right eye exhibits exudate. Right eye exhibits no chemosis, no discharge and no hordeolum. No foreign body present in the right eye. Left eye exhibits exudate and hordeolum. Left eye exhibits no chemosis and no discharge. No foreign body present in the left eye. Right conjunctiva is not injected. Left conjunctiva is not injected.  Neck: Neck supple.  Cardiovascular: Normal rate and regular  rhythm.   Pulmonary/Chest: Effort normal and breath sounds normal. She has no wheezes. She has no rales.  Neurological: She is alert.  Skin: No rash noted.  Psychiatric: She has a normal mood and affect.    ED Course  Procedures (including critical care time)  7:06 AM The patient here with a constellation of complaints. The most notable complaint is swelling to the left upper eyelid consistent with a stye. No evidence of conjunctivitis, no foreign object noted. She does complaining of crusting of both eyes which is likely viral  however plan to provide erythromycin ointment both for lubrication and also to prevent bacterial infection. She endorses a productive cough for the past 2 weeks that has been improving, no evidence of hypoxia, lung exam is unremarkable, low suspicion for pneumonia. Patient I agree that chest x-ray is is not indicated at this time. Return precautions discussed. Labs Review Labs Reviewed - No data to display  Imaging Review No results found.   EKG Interpretation None      MDM   Final diagnoses:  Stye, left    BP 140/69  Pulse 71  Temp(Src) 98.2 F (36.8 C) (Oral)  Resp 14  Ht  (1.6 m)  Wt 135 lb (61.236 kg)  BMI 23.92 kg/m2  SpO2 100%     Fayrene Helper, PA-C 09/24/13 [phone]

## 2013-09-24 NOTE — ED Provider Notes (Signed)
Medical screening examination/treatment/procedure(s) were performed by non-physician practitioner and as supervising physician I was immediately available for consultation/collaboration.   EKG Interpretation None       Olivia Mackie, MD 09/24/13 [phone]

## 2013-09-24 NOTE — ED Notes (Signed)
Pt presents with L eye swelling x 2 weeks, pt states R eye was swollen now L eye. Pt states facial swelling began with toothache

## 2013-09-24 NOTE — Discharge Instructions (Signed)

## 2013-09-25 ENCOUNTER — Emergency Department (HOSPITAL_COMMUNITY)
Admission: EM | Admit: 2013-09-25 | Discharge: 2013-09-25 | Disposition: A | Discharge status: Home / Self Care | Payer: Self-pay | Attending: Emergency Medicine | Admitting: Emergency Medicine

## 2013-09-25 ENCOUNTER — Encounter (HOSPITAL_COMMUNITY): Payer: Self-pay | Admitting: Emergency Medicine

## 2013-09-25 DIAGNOSIS — Z792 Long term (current) use of antibiotics: Secondary | ICD-10-CM | POA: Insufficient documentation

## 2013-09-25 DIAGNOSIS — H5789 Other specified disorders of eye and adnexa: Secondary | ICD-10-CM | POA: Insufficient documentation

## 2013-09-25 DIAGNOSIS — L03213 Periorbital cellulitis: Secondary | ICD-10-CM

## 2013-09-25 DIAGNOSIS — H05019 Cellulitis of unspecified orbit: Secondary | ICD-10-CM | POA: Insufficient documentation

## 2013-09-25 DIAGNOSIS — I252 Old myocardial infarction: Secondary | ICD-10-CM | POA: Insufficient documentation

## 2013-09-25 DIAGNOSIS — Z87891 Personal history of nicotine dependence: Secondary | ICD-10-CM | POA: Insufficient documentation

## 2013-09-25 DIAGNOSIS — Z79899 Other long term (current) drug therapy: Secondary | ICD-10-CM | POA: Insufficient documentation

## 2013-09-25 DIAGNOSIS — I2510 Atherosclerotic heart disease of native coronary artery without angina pectoris: Secondary | ICD-10-CM | POA: Insufficient documentation

## 2013-09-25 DIAGNOSIS — Z7982 Long term (current) use of aspirin: Secondary | ICD-10-CM | POA: Insufficient documentation

## 2013-09-25 DIAGNOSIS — Z9889 Other specified postprocedural states: Secondary | ICD-10-CM | POA: Insufficient documentation

## 2013-09-25 MED ORDER — NAPROXEN 500 MG PO TABS: 500.0000 mg | ORAL_TABLET | Freq: Two times a day (BID) | ORAL | Status: DC

## 2013-09-25 MED ORDER — CEPHALEXIN 500 MG PO CAPS: 500.0000 mg | ORAL_CAPSULE | Freq: Four times a day (QID) | ORAL | Status: DC

## 2013-09-25 MED ORDER — CEPHALEXIN 500 MG PO CAPS
500.0000 mg | ORAL_CAPSULE | Freq: Once | ORAL | Status: AC
  Administered 2013-09-25: 500 mg via ORAL
  Filled 2013-09-25: qty 1

## 2013-09-25 MED ORDER — CIPROFLOXACIN HCL 0.3 % OP SOLN: 1.0000 [drp] | OPHTHALMIC | Status: DC

## 2013-09-25 NOTE — ED Notes (Signed)
Patient c/o left eye swelling since 09/10/13. Patient states she was seen here yesterday and was told to come back if symptoms did not improve. Patient states she has not attempted to make any of the referrals given yesterday, states she has been taking the medications prescribed.

## 2013-09-25 NOTE — Discharge Instructions (Signed)
Please call your doctor for a followup appointment within 24-48 hours. When you talk to your doctor please let them know that you were seen in the emergency department and have them acquire all of your records so that they can discuss the findings with you and formulate a treatment plan to fully care for your new and ongoing problems. ° ° °Emergency Department Resource Guide °1) Find a Doctor and Pay Out of Pocket °Although you won't have to find out who is covered by your insurance plan, it is a good idea to ask around and get recommendations. You will then need to call the office and see if the doctor you have chosen will accept you as a new patient and what types of options they offer for patients who are self-pay. Some doctors offer discounts or will set up payment plans for their patients who do not have insurance, but you will need to ask so you aren't surprised when you get to your appointment. ° °2) Contact Your Local Health Department °Not all health departments have doctors that can see patients for sick visits, but many do, so it is worth a call to see if yours does. If you don't know where your local health department is, you can check in your phone book. The CDC also has a tool to help you locate your state's health department, and many state websites also have listings of all of their local health departments. ° °3) Find a Walk-in Clinic °If your illness is not likely to be very severe or complicated, you may want to try a walk in clinic. These are popping up all over the country in pharmacies, drugstores, and shopping centers. They're usually staffed by nurse practitioners or physician assistants that have been trained to treat common illnesses and complaints. They're usually fairly quick and inexpensive. However, if you have serious medical issues or chronic medical problems, these are probably not your best option. ° °No Primary Care Doctor: °- Call [facility] at  832-8000 - they can help you  locate a primary care doctor that  accepts your insurance, provides certain services, etc. °- Physician Referral Service- 1-800-533-3463 ° °Chronic Pain Problems: °Organization         Address  Phone   Notes  °[facility]  ([phone]) [phone] Patients need to be referred by their primary care doctor.  ° °Medication Assistance: °Organization         Address  Phone   Notes  °[place] [facility] [address] °[place], [place] [address] [phone]) [phone] --Must be a resident of [place] °-- Must have NO insurance coverage whatsoever (no Medicaid/ Medicare, etc.) °-- The pt. MUST have a primary care doctor that directs their care regularly and follows them in the community °  °[facility]  (866) 331-1348   °[employer]  (888) 892-1162   ° °Agencies that provide inexpensive medical care: °Organization         Address  Phone   Notes  °[facility]  ([phone]) [phone]   °[facility]    (336) 832-7272   °[facility] [address] °[place], [place] [address] ([phone]) [phone]   °[facility] [address], °[place] ([phone]) [phone]   °[facility]    (336) 373-0678   °[facility]    (336) 272-1050   °[facility] ° [address],  Phone:  (336)   832-4444, Fax:  (336) [phone] Hours of Operation:  9 am - 6 pm, M-F.  Also accepts Medicaid/Medicare and self-pay.  °[facility] ° [address], [place] Phone: (336) [phone], Fax: (336) [phone]. Hours of Operation:  8:30 am - 5:30 pm, M-F.  Also accepts Medicaid and self-pay.  °[facility] [place] [address], [place] Phone: (336) 878-6027   °[facility] [address], [place], [place] (336)723-1848, Ext. 123 Mondays & Thursdays: 7-9 AM.  First 15 patients are seen on a first come, first serve basis. °  ° °Medicaid-accepting [place]  Providers: ° °Organization         Address  Phone   Notes  °[facility] [address], [place] ([phone]) [phone] Also accepts self-pay patients.  °[facility] [address], [place] ° (336) [phone]   °[facility] [address], [place] (336) [phone]   °[facility] [address] [place] [address], [place] (336) [phone]   °[facility] [address], [place]  ° (336) [phone] Only accepts [place] Access Medicaid patients after they have their name applied to their card.  ° °Self-Pay (no insurance) in [place]: ° °Organization         Address  Phone   Notes  °Sickle Cell Patients, [facility] [address], [place] ([phone]) [phone]   °[facility] [address], [place] ([phone]) [phone]   °[facility] ° [address], [place] (336) [phone]   °[facility]/Dr. Osei-Bonsu ° [address] [place] [address], [place] or [address], [place] [phone]) [phone] Phone number for both [place] and [place] locations is the same.  °[facility] [address], [place] (336) [phone]   °[facility] [address] [place] [address], [place] or [address] (336) [phone] °(336) [phone]   °[facility] [address], [place] (336) 350-1642, phone; (336) [phone], fax Sees patients 1st and 3rd Saturday of every month.  Must not qualify for public or private insurance (i.e. Medicaid, Medicare, [place] [employer] Choice, Veterans' Benefits) • Household income should be no more than 200% of the poverty level •The clinic cannot treat you if you are pregnant or think you are pregnant • Sexually transmitted diseases are not treated at the clinic.  ° ° °

## 2013-09-25 NOTE — ED Provider Notes (Signed)
CSN: [id number]     Arrival date & time 09/25/13  0515 History   First MD Initiated Contact with Patient 09/25/13 [phone]     Chief Complaint  Patient presents with  . Facial Swelling    left, seen yesterday for same     (Consider location/radiation/quality/duration/timing/severity/associated sxs/prior Treatment) HPI Comments: 53 year old female with complaint of left eye swelling which started a couple of weeks ago, became much worse over the last 24 hours and is now involved with swelling of her upper eyelid. She was started on erythromycin ointment yesterday and has been using warm compresses but the swelling continues to get worse. This does not affect her vision and she has no pain with extraocular movements. She denies fevers chills nausea vomiting coughing or shortness of breath and has no other upper respiratory symptoms including sinus pressure or drainage  The history is provided by the patient and medical records.    Past Medical History  Diagnosis Date  . Coronary artery disease   . MI, old   . Acute myopericarditis    Past Surgical History  Procedure Laterality Date  . No past surgeries    . Cardiac catheterization     History reviewed. No pertinent family history. History  Substance Use Topics  . Smoking status: Former Smoker -- 0.50 packs/day    Types: Cigarettes    Quit date: 01/29/2012  . Smokeless tobacco: Not on file  . Alcohol Use: No   OB History   Grav Para Term Preterm Abortions TAB SAB Ect Mult Living                 Review of Systems  Constitutional: Negative for fever.  Eyes: Positive for redness. Negative for pain, discharge and itching.      Allergies  Sulfonamide derivatives  Home Medications   Prior to Admission medications   Medication Sig Start Date End Date Taking? Authorizing Provider  aspirin 81 MG chewable tablet Chew 81 mg by mouth daily.    Historical Provider, MD  cephALEXin (KEFLEX) 500 MG capsule Take 1 capsule (500 mg  total) by mouth 4 (four) times daily. 09/25/13   Vida Roller, MD  ciprofloxacin (CILOXAN) 0.3 % ophthalmic solution Place 1 drop into the left eye every 4 (four) hours while awake. Place one drop in effected eye every 4 hours until follow up with opthalmologist 09/25/13   Vida Roller, MD  ibuprofen (ADVIL,MOTRIN) 200 MG tablet Take 200 mg by mouth every 6 (six) hours as needed for moderate pain.    Historical Provider, MD  naproxen (NAPROSYN) 500 MG tablet Take 1 tablet (500 mg total) by mouth 2 (two) times daily with a meal. 09/25/13   Vida Roller, MD   BP 128/73  Pulse 73  Temp(Src) 98.8 F (37.1 C) (Oral)  Resp 18  Ht  (1.6 m)  Wt 135 lb (61.236 kg)  BMI 23.92 kg/m2  SpO2 100% Physical Exam  Nursing note and vitals reviewed. Constitutional: She appears well-developed and well-nourished.  HENT:  Head: Normocephalic and atraumatic.  Eyes: Conjunctivae and EOM are normal. Pupils are equal, round, and reactive to light. Right eye exhibits no discharge. Left eye exhibits no discharge.  Left upper eyelid with erythema, edema and mild tenderness, no swelling of the lower lid, no drainage, no obvious stye  Pulmonary/Chest: Effort normal. No respiratory distress.  Neurological: She is alert. Coordination normal.  Skin: Skin is warm and dry. No rash noted. She is not diaphoretic. No  erythema.  Psychiatric: She has a normal mood and affect.    ED Course  Procedures (including critical care time) Labs Review Labs Reviewed - No data to display  Imaging Review No results found.    MDM   Final diagnoses:  Preseptal cellulitis of left eye    The patient will be given a different antibiotic topically as well as oral antibiotics for periorbital cellulitis, vital signs normal, stable for discharge.  Meds given in ED:  Medications  cephALEXin (KEFLEX) capsule 500 mg (500 mg Oral Given 09/25/13 0543)    Discharge Medication List as of 09/25/2013  5:40 AM    START taking  these medications   Details  cephALEXin (KEFLEX) 500 MG capsule Take 1 capsule (500 mg total) by mouth 4 (four) times daily., Starting 09/25/2013, Until Discontinued, Print    ciprofloxacin (CILOXAN) 0.3 % ophthalmic solution Place 1 drop into the left eye every 4 (four) hours while awake. Place one drop in effected eye every 4 hours until follow up with opthalmologist, Starting 09/25/2013, Until Discontinued, Print    naproxen (NAPROSYN) 500 MG tablet Take 1 tablet (500 mg total) by mouth 2 (two) times daily with a meal., Starting 09/25/2013, Until Discontinued, Print            Vida Roller, MD 09/25/13 [phone]

## 2014-01-07 ENCOUNTER — Encounter (HOSPITAL_COMMUNITY): Payer: Self-pay | Admitting: Interventional Cardiology

## 2014-05-06 ENCOUNTER — Ambulatory Visit: Payer: Self-pay

## 2014-05-21 ENCOUNTER — Ambulatory Visit: Payer: Self-pay | Attending: Internal Medicine

## 2015-08-08 ENCOUNTER — Ambulatory Visit: Payer: Self-pay

## 2015-08-21 ENCOUNTER — Encounter (HOSPITAL_COMMUNITY): Payer: Self-pay

## 2015-08-21 ENCOUNTER — Emergency Department (HOSPITAL_COMMUNITY)
Admission: EM | Admit: 2015-08-21 | Discharge: 2015-08-21 | Disposition: A | Discharge status: Home / Self Care | Payer: Self-pay | Attending: Emergency Medicine | Admitting: Emergency Medicine

## 2015-08-21 DIAGNOSIS — Z87891 Personal history of nicotine dependence: Secondary | ICD-10-CM | POA: Insufficient documentation

## 2015-08-21 DIAGNOSIS — I2510 Atherosclerotic heart disease of native coronary artery without angina pectoris: Secondary | ICD-10-CM | POA: Insufficient documentation

## 2015-08-21 DIAGNOSIS — Z7982 Long term (current) use of aspirin: Secondary | ICD-10-CM | POA: Insufficient documentation

## 2015-08-21 DIAGNOSIS — B070 Plantar wart: Secondary | ICD-10-CM | POA: Insufficient documentation

## 2015-08-21 MED ORDER — IBUPROFEN 400 MG PO TABS
600.0000 mg | ORAL_TABLET | ORAL | Status: AC
  Administered 2015-08-21: 600 mg via ORAL
  Filled 2015-08-21: qty 1

## 2015-08-21 MED ORDER — SALICYLIC ACID 17 % EX GEL: Freq: Every day | CUTANEOUS | 0 refills | Status: DC

## 2015-08-21 NOTE — Discharge Instructions (Signed)
Read the information below.  Use the prescribed medication as directed.  Please discuss all new medications with your pharmacist.  You may return to the Emergency Department at any time for worsening condition or any new symptoms that concern you.     If you develop redness, swelling, pus draining from the wound, or fevers greater than 100.4, return to the ER immediately for a recheck.   °

## 2015-08-21 NOTE — ED Provider Notes (Signed)
[facility]-EMERGENCY DEPT Provider Note   CSN: [id number] Arrival date & time: 08/21/15  0745  First Provider Contact:  08/21/15 8:31am       History   Chief Complaint Chief Complaint  Patient presents with  . Foot Pain    HPI Haley Turner is a 55 y.o. female.  HPI  Pt presents with painful lesion on the bottom of her left foot.  It has been present > 1 year, worsened over the past few months, significantly worse over the past two weeks.  She has tried no medications for it but has used different footwear and cushions for comfort.  She is now walking on the side of her foot for comfort.  Denies fevers, weakness, numbness, leg swelling.   Past Medical History:  Diagnosis Date  . Acute myopericarditis   . Coronary artery disease   . MI, old     Patient Active Problem List   Diagnosis Date Noted  . Acute myopericarditis 03/13/2011  . NSTEMI (non-ST elevated myocardial infarction) (HCC) 03/11/2011  . GONORRHEA 09/23/2009  . 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 PANIC DISORDER 09/23/2009  . DEPRESSION 09/23/2009  . CARPAL TUNNEL SYNDROME, LEFT 09/23/2009  . 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 CHEST PAIN 09/23/2009  . CHLAMYDIAL INFECTION, HX OF 09/23/2009    Past Surgical History:  Procedure Laterality Date  . CARDIAC CATHETERIZATION    . LEFT HEART CATHETERIZATION WITH CORONARY ANGIOGRAM N/A 03/12/2011   Procedure: LEFT HEART CATHETERIZATION WITH CORONARY ANGIOGRAM;  Surgeon: Corky Crafts, MD;  [facility] LAB;  Service: Cardiovascular;  Laterality: N/A;  . NO PAST SURGERIES      OB History    No data available       Home Medications    Prior to Admission medications   Medication Sig Start Date End Date Taking? Authorizing Provider  aspirin 81 MG chewable tablet Chew 81 mg by mouth daily.    Historical Provider, MD  cephALEXin (KEFLEX) 500 MG capsule Take 1 capsule (500 mg total) by mouth 4 (four) times daily. 09/25/13   Eber Hong, MD  ciprofloxacin (CILOXAN) 0.3 %  ophthalmic solution Place 1 drop into the left eye every 4 (four) hours while awake. Place one drop in effected eye every 4 hours until follow up with opthalmologist 09/25/13   Eber Hong, MD  ibuprofen (ADVIL,MOTRIN) 200 MG tablet Take 200 mg by mouth every 6 (six) hours as needed for moderate pain.    Historical Provider, MD  naproxen (NAPROSYN) 500 MG tablet Take 1 tablet (500 mg total) by mouth 2 (two) times daily with a meal. 09/25/13   Eber Hong, MD    Family History No family history on file.  Social History Social History  Substance Use Topics  . Smoking status: Former Smoker    Packs/day: 0.50    Types: Cigarettes    Quit date: 01/29/2012  . Smokeless tobacco: Never Used  . Alcohol use No     Allergies   Sulfonamide derivatives   Review of Systems Review of Systems  Constitutional: Negative for chills and fever.  Musculoskeletal: Positive for gait problem. Negative for arthralgias.  Skin: Positive for wound. Negative for color change.  Allergic/Immunologic: Negative for immunocompromised state.  Neurological: Negative for weakness and numbness.  Hematological: Does not bruise/bleed easily.  Psychiatric/Behavioral: Negative for self-injury.     Physical Exam Updated Vital Signs BP 116/92 (BP Location: Right Arm)   Pulse 83   Temp 99 F (37.2 C) (Oral)   Resp 18  SpO2 100%   Physical Exam  Constitutional: She appears well-developed and well-nourished.  HENT:  Head: Normocephalic and atraumatic.  Neck: Neck supple.  Pulmonary/Chest: Effort normal.  Neurological: She is alert.  Skin:  Left foot plantar aspect over 2-3 MTP there is a plantar wart.  TTP. No erythema, edema, discharge.    Nursing note and vitals reviewed.    ED Treatments / Results  Labs (all labs ordered are listed, but only abnormal results are displayed) Labs Reviewed - No data to display  EKG  EKG Interpretation None       Radiology No results  found.  Procedures Procedures (including critical care time)  Medications Ordered in ED Medications - No data to display   Initial Impression / Assessment and Plan / ED Course  I have reviewed the triage vital signs and the nursing notes.  Pertinent labs & imaging results that were available during my care of the patient were reviewed by me and considered in my medical decision making (see chart for details).  Clinical Course    Afebrile, nontoxic patient with plantar warm of left foot.  Not infected.   D/C home with podiatry follow up, salicylic acid.   Discussed result, findings, treatment, and follow up  with patient.  Pt given return precautions.  Pt verbalizes understanding and agrees with plan.       Final Clinical Impressions(s) / ED Diagnoses   Final diagnoses:  Plantar wart of left foot    New Prescriptions New Prescriptions   SALICYLIC ACID 17 % GEL    Apply topically daily. Daily to twice daily for 12 weeks or until wart has resolved.     Trixie Dredge, PA-C 08/21/15 0847    Trixie Dredge, PA-C 08/21/15 1030    Maia Plan, MD 08/21/15 [phone]

## 2015-08-21 NOTE — ED Triage Notes (Signed)
Patient here with left foot pain for months, pain to bottom of foot with ambulation, thinks she has spur or old blister to same, denies trauma

## 2015-09-04 ENCOUNTER — Encounter (HOSPITAL_COMMUNITY): Payer: Self-pay | Admitting: *Deleted

## 2015-09-04 ENCOUNTER — Emergency Department (HOSPITAL_COMMUNITY)
Admission: EM | Admit: 2015-09-04 | Discharge: 2015-09-04 | Disposition: A | Discharge status: Home / Self Care | Payer: Medicaid Other | Attending: Emergency Medicine | Admitting: Emergency Medicine

## 2015-09-04 ENCOUNTER — Emergency Department (HOSPITAL_COMMUNITY): Payer: Medicaid Other

## 2015-09-04 DIAGNOSIS — I2510 Atherosclerotic heart disease of native coronary artery without angina pectoris: Secondary | ICD-10-CM | POA: Insufficient documentation

## 2015-09-04 DIAGNOSIS — Z7982 Long term (current) use of aspirin: Secondary | ICD-10-CM | POA: Insufficient documentation

## 2015-09-04 DIAGNOSIS — M545 Low back pain, unspecified: Secondary | ICD-10-CM

## 2015-09-04 DIAGNOSIS — I252 Old myocardial infarction: Secondary | ICD-10-CM | POA: Insufficient documentation

## 2015-09-04 DIAGNOSIS — Z79899 Other long term (current) drug therapy: Secondary | ICD-10-CM | POA: Insufficient documentation

## 2015-09-04 DIAGNOSIS — F32A Depression, unspecified: Secondary | ICD-10-CM

## 2015-09-04 DIAGNOSIS — F17210 Nicotine dependence, cigarettes, uncomplicated: Secondary | ICD-10-CM | POA: Insufficient documentation

## 2015-09-04 DIAGNOSIS — F329 Major depressive disorder, single episode, unspecified: Secondary | ICD-10-CM

## 2015-09-04 DIAGNOSIS — R45851 Suicidal ideations: Secondary | ICD-10-CM | POA: Insufficient documentation

## 2015-09-04 HISTORY — DX: Suicidal ideations: R45.851

## 2015-09-04 HISTORY — DX: Depression, unspecified: F32.A

## 2015-09-04 HISTORY — DX: Major depressive disorder, single episode, unspecified: F32.9

## 2015-09-04 LAB — URINALYSIS, ROUTINE W REFLEX MICROSCOPIC
BILIRUBIN URINE: NEGATIVE
GLUCOSE, UA: NEGATIVE mg/dL
Ketones, ur: NEGATIVE mg/dL
Leukocytes, UA: NEGATIVE
Nitrite: NEGATIVE
PROTEIN: NEGATIVE mg/dL
Specific Gravity, Urine: 1.017 (ref 1.005–1.030)
pH: 6 (ref 5.0–8.0)

## 2015-09-04 LAB — ETHANOL: Alcohol, Ethyl (B): <5 mg/dL (ref <5)

## 2015-09-04 LAB — URINE MICROSCOPIC-ADD ON

## 2015-09-04 LAB — COMPREHENSIVE METABOLIC PANEL
ALK PHOS: 64 U/L (ref 38–126)
ALT: 15 U/L (ref 14–54)
ANION GAP: 10 (ref 5–15)
AST: 19 U/L (ref 15–41)
Albumin: 4.1 g/dL (ref 3.5–5.0)
BUN: 6 mg/dL (ref 6–20)
CALCIUM: 9.3 mg/dL (ref 8.9–10.3)
CO2: 24 mmol/L (ref 22–32)
Chloride: 106 mmol/L (ref 101–111)
Creatinine, Ser: 0.68 mg/dL (ref 0.44–1.00)
GFR calc Af Amer: >60 mL/min (ref >60)
GFR calc non Af Amer: >60 mL/min (ref >60)
GLUCOSE: 74 mg/dL (ref 65–99)
Potassium: 3.8 mmol/L (ref 3.5–5.1)
Sodium: 140 mmol/L (ref 135–145)
Total Bilirubin: 0.8 mg/dL (ref 0.3–1.2)
Total Protein: 7.1 g/dL (ref 6.5–8.1)

## 2015-09-04 LAB — RAPID URINE DRUG SCREEN, HOSP PERFORMED
Amphetamines: NOT DETECTED
BARBITURATES: NOT DETECTED
Benzodiazepines: NOT DETECTED
Cocaine: NOT DETECTED
Opiates: NOT DETECTED
TETRAHYDROCANNABINOL: NOT DETECTED

## 2015-09-04 LAB — CBC WITH DIFFERENTIAL/PLATELET
Basophils Absolute: 0 10*3/uL (ref 0.0–0.1)
Basophils Relative: 1 %
EOS ABS: 0.1 10*3/uL (ref 0.0–0.7)
Eosinophils Relative: 2 %
HEMATOCRIT: 44.9 % (ref 36.0–46.0)
HEMOGLOBIN: 14.5 g/dL (ref 12.0–15.0)
LYMPHS ABS: 2 10*3/uL (ref 0.7–4.0)
LYMPHS PCT: 31 %
MCH: 28.4 pg (ref 26.0–34.0)
MCHC: 32.3 g/dL (ref 30.0–36.0)
MCV: 87.9 fL (ref 78.0–100.0)
MONOS PCT: 14 %
Monocytes Absolute: 0.9 10*3/uL (ref 0.1–1.0)
NEUTROS ABS: 3.4 10*3/uL (ref 1.7–7.7)
NEUTROS PCT: 52 %
Platelets: 237 10*3/uL (ref 150–400)
RBC: 5.11 MIL/uL (ref 3.87–5.11)
RDW: 12.9 % (ref 11.5–15.5)
WBC: 6.4 10*3/uL (ref 4.0–10.5)

## 2015-09-04 LAB — I-STAT TROPONIN, ED: TROPONIN I, POC: 0.04 ng/mL (ref 0.00–0.08)

## 2015-09-04 MED ORDER — ASPIRIN 81 MG PO CHEW: 324.0000 mg | CHEWABLE_TABLET | Freq: Once | ORAL | Status: DC

## 2015-09-04 MED ORDER — NAPROXEN 500 MG PO TABS: 500.0000 mg | ORAL_TABLET | Freq: Two times a day (BID) | ORAL | 0 refills | Status: DC

## 2015-09-04 MED ORDER — ASPIRIN 81 MG PO CHEW
81.0000 mg | CHEWABLE_TABLET | Freq: Every day | ORAL | Status: DC
  Administered 2015-09-04: 81 mg via ORAL
  Filled 2015-09-04: qty 1

## 2015-09-04 MED ORDER — LIDOCAINE 5 % EX PTCH
1.0000 | MEDICATED_PATCH | CUTANEOUS | Status: DC
  Administered 2015-09-04: 1 via TRANSDERMAL
  Filled 2015-09-04: qty 1

## 2015-09-04 MED ORDER — METHOCARBAMOL 500 MG PO TABS
500.0000 mg | ORAL_TABLET | Freq: Once | ORAL | Status: AC
  Administered 2015-09-04: 500 mg via ORAL
  Filled 2015-09-04: qty 1
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
## 2015-09-04 NOTE — ED Provider Notes (Signed)
[facility]-EMERGENCY DEPT Provider Note   CSN: [id number] Arrival date & time: 09/04/15  4098  First Provider Contact:  First MD Initiated Contact with Patient 09/04/15 [phone]     By signing my name below, I, Rosario Adie, attest that this documentation has been prepared under the direction and in the presence of [facility], PA-C.  Electronically Signed: Rosario Adie, ED Scribe. 09/04/15. 9:02 AM.  History   Chief Complaint Chief Complaint  Patient presents with  . Back Pain   The history is provided by the patient. No language interpreter was used.   HPI Comments: Haley Turner is a 55 y.o. female who presents to the Emergency Department complaining of sudden onset, unchanged, constant, 9/10 lower back pain onset ~1 day ago. She reports associated urinary frequency. Pt is unsure of specific trauma/injury to the area, but notes that she works as a Advertising account planner and that she had to bend further toward the ground than normal yesterday with a client and noticed her pain after that incident. Denies her pain radiating down the legs. Her pain exacerbated w/ twisting movements. Pt notes that she took 1 dose of Ibuprofen this AM PTA with minimal relief of her pain. No hx of cancer. No IVDA. No hx of similar pain. Denies bowel/bladder incontinence, dysuria, or any other associated symptoms.   Pt is also complaining of suicidal ideation, with no specific plan to harm herself. When pressed about her SI, she notes that "I have had thoughts about not living" in that past. She states that she has had a lot of recent stressful events in her life recently that have caused these thoughts, including increased memory loss affecting her work, recent health problems, and recent job loss and transition. She has seen a counselor in the past for her hx of SI; however she has never been on a course of anti-depressants in the past. Pt socially drinks alcohol, and when pressed she states "I sometimes  drink to feel better". Denies hx of heavy drinking or withdrawal.  She additionally reports that she has been having left-sided chest tightness "for months", and states that it is intermittent, 7/10 and exacerbated with her stress. She does smoke cigarettes. Pt has a FHx of MI. Pt reports that she is supposed to be taking  Asprin daily for her hx of heart issues, including NSTEMI, but states "I forget to take them sometimes". Denies illicit drug usage.     PCP: none  Past Medical History:  Diagnosis Date  . Acute myopericarditis   . Coronary artery disease   . Depression   . MI, old   . Suicide ideation     Patient Active Problem List   Diagnosis Date Noted  . Acute myopericarditis 03/13/2011  . NSTEMI (non-ST elevated myocardial infarction) (HCC) 03/11/2011  . GONORRHEA 09/23/2009  . 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 PANIC DISORDER 09/23/2009  . DEPRESSION 09/23/2009  . CARPAL TUNNEL SYNDROME, LEFT 09/23/2009  . 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 CHEST PAIN 09/23/2009  . CHLAMYDIAL INFECTION, HX OF 09/23/2009    Past Surgical History:  Procedure Laterality Date  . CARDIAC CATHETERIZATION    . LEFT HEART CATHETERIZATION WITH CORONARY ANGIOGRAM N/A 03/12/2011   Procedure: LEFT HEART CATHETERIZATION WITH CORONARY ANGIOGRAM;  Surgeon: Corky Crafts, MD;  Location: [facility] CATH LAB;  Service: Cardiovascular;  Laterality: N/A;  . NO PAST SURGERIES      OB History    No data available       Home Medications  Prior to Admission medications   Medication Sig Start Date End Date Taking? Authorizing Provider  aspirin 81 MG chewable tablet Chew 81 mg by mouth daily.    Historical Provider, MD  cephALEXin (KEFLEX) 500 MG capsule Take 1 capsule (500 mg total) by mouth 4 (four) times daily. 09/25/13   Eber HongBrian Miller, MD  ciprofloxacin (CILOXAN) 0.3 % ophthalmic solution Place 1 drop into the left eye every 4 (four) hours while awake. Place one drop in effected eye every 4 hours until follow up  with opthalmologist 09/25/13   Eber HongBrian Miller, MD  ibuprofen (ADVIL,MOTRIN) 200 MG tablet Take 200 mg by mouth every 6 (six) hours as needed for moderate pain.    Historical Provider, MD  naproxen (NAPROSYN) 500 MG tablet Take 1 tablet (500 mg total) by mouth 2 (two) times daily with a meal. 09/25/13   Eber HongBrian Miller, MD  salicylic acid 17 % gel Apply topically daily. Daily to twice daily for 12 weeks or until wart has resolved. 08/21/15   Trixie DredgeEmily West, PA-C    Family History No family history on file.  Social History Social History  Substance Use Topics  . Smoking status: Current Every Day Smoker    Packs/day: 0.50    Types: Cigarettes    Last attempt to quit: 01/29/2012  . Smokeless tobacco: Never Used  . Alcohol use Yes     Comment: occasionally     Allergies   Sulfonamide derivatives   Review of Systems Review of Systems A complete 10 system review of systems was obtained and all systems are negative except as noted in the HPI and PMH.   Physical Exam Updated Vital Signs BP 137/98 (BP Location: Left Arm)   Pulse 67   Temp 98.4 F (36.9 C) (Oral)   Resp 18   Ht 5\' 4"  (1.626 m)   Wt 135 lb (61.2 kg)   SpO2 100%   BMI 23.17 kg/m   Physical Exam  Constitutional: She is oriented to person, place, and time. She appears well-developed and well-nourished. No distress.  HENT:  Head: Normocephalic and atraumatic.  Mouth/Throat: Oropharynx is clear and moist.  Eyes: Conjunctivae and EOM are normal. Pupils are equal, round, and reactive to light.  Neck: Normal range of motion. No JVD present. No tracheal deviation present.  Cardiovascular: Normal rate, regular rhythm and intact distal pulses.   Radial pulse equal bilaterally  Pulmonary/Chest: Effort normal and breath sounds normal. No stridor. No respiratory distress. She has no wheezes. She has no rales. She exhibits no tenderness.  Abdominal: Soft. She exhibits no distension and no mass. There is no tenderness. There is no  rebound and no guarding.  Musculoskeletal: Normal range of motion. She exhibits no edema or tenderness.  No calf asymmetry, superficial collaterals, palpable cords, edema, Homans sign negative bilaterally.    Neurological: She is alert and oriented to person, place, and time.  No point tenderness to percussion of lumbar spinal processes.  No TTP or paraspinal muscular spasm. Strength is 5 out of 5 to bilateral lower extremities at hip and knee; extensor hallucis longus 5 out of 5. Ankle strength 5 out of 5, no clonus, neurovascularly intact. No saddle anaesthesia. Patellar reflexes are 2+ bilaterally.    Patient ambulates with a coordinated in nonantalgic gait   Skin: Skin is warm and dry. She is not diaphoretic.  Psychiatric: Her affect is blunt. Her speech is delayed. She is slowed and withdrawn. She exhibits a depressed mood. She expresses suicidal ideation. She  expresses no homicidal ideation. She expresses suicidal plans.  Nursing note and vitals reviewed.  ED Treatments / Results  DIAGNOSTIC STUDIES: Oxygen Saturation is 100% on RA, normal by my interpretation.   COORDINATION OF CARE: 8:57 AM-Discussed next steps with pt. Pt verbalized understanding and is agreeable with the plan.   Labs (all labs ordered are listed, but only abnormal results are displayed) Labs Reviewed  COMPREHENSIVE METABOLIC PANEL  ETHANOL  CBC WITH DIFFERENTIAL/PLATELET  URINE RAPID DRUG SCREEN, HOSP PERFORMED  URINALYSIS, ROUTINE W REFLEX MICROSCOPIC (NOT AT [facility])  I-STAT TROPOININ, ED    EKG  EKG Interpretation None       Radiology No results found.  Procedures Procedures (including critical care time)  Medications Ordered in ED Medications - No data to display   Initial Impression / Assessment and Plan / ED Course  I have reviewed the triage vital signs and the nursing notes.  Pertinent labs & imaging results that were available during my care of the patient were reviewed by me and  considered in my medical decision making (see chart for details).  Clinical Course    Vitals:   09/04/15 0833  BP: 137/98  Pulse: 67  Resp: 18  Temp: 98.4 F (36.9 C)  TempSrc: Oral  SpO2: 100%  Weight: 61.2 kg  Height: 5\' 4"  (1.626 m)    Medications  lidocaine (LIDODERM) 5 % 1 patch (not administered)  aspirin chewable tablet 324 mg (not administered)  methocarbamol (ROBAXIN) tablet 500 mg (not administered)    Haley Turner is 55 y.o. 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 Neurologic exam is reassuring, no incontinence or red flags. Patient also notes an intermittent chest pain described as tight exacerbated by stress onset several months ago, I doubt that this is ACS however given her lack of primary care and history of smoking and family history of ACS will perform basic cardiac workup. Patient is tearful, vague suicidal ideation. I think she would benefit from a TTS evaluation. Case management consulted to help this patient establish primary care and possibly apply for the orange card.   Patient will need higher level of care than fast track, patient is transferred to acute pod and case is signed out to PA Sam who will follow-up cardiology workup and perform medical clearance.   Final Clinical Impressions(s) / ED Diagnoses   Final diagnoses:  None    New Prescriptions New Prescriptions   No medications on file   I personally performed the services described in this documentation, which was scribed in my presence.  The recorded information has been reviewed and is accurate.     Joni Reining Gurdeep Keesey, PA-C 09/04/15 1610    Vanetta Mulders, MD 09/04/15 [phone]

## 2015-09-04 NOTE — ED Provider Notes (Addendum)
Care assumed from [facility], PA-C. Haley Turner is an 55 y.o. female originally seen in [facility] for initial complaint of back pain. She works as a Advertising account planner and has had soreness in her lower back since yesterday after bending over to do a wheelchair-bound client's nails. No focal neuro findings and pain is likely musculoskeletal. She is transferred to a higher acuity pod for continued care as pt also reports intermittent chest pain for the last several months as well as feeling very depressed, hopeless, and at times suicidal.  Pt reports intermittent dull, aching, left sided chest pain/pressure. She states it really only occurs when she is in an emotional or stressful situation. She denies any pain or discomfort in her chest at this time. She states the pain is not exertional and is not associated with any other factors. She states this does not feel like the NSTEMI she had several years ago.   Haley Turner reports she has had a history of depression in the past and has taken Prozac and Zoloft before. For the past several years has done well from a mental health standpoint. However, she states lately she is feeling more and more depressed. She states she feels overwhelmed and hopeless. She states as she has gotten older her health has made it harder for her to do her job well and consequently she is getting a lot of negative feedback from her supervisor and now unable to pay her bills as she is working fewer hours. She states she is having chronic, progressive, gradual worsening of her vision and memory that makes her job difficult to perform. She states she is also overwhelmed because she has been unable to follow up with any kind of primary care. She states she at times feels very depressed and sometimes suicidal without a definite plan. Denies HI/AH/VH. Denies substance use.   Physical Exam  BP 137/98 (BP Location: Left Arm)   Pulse 67   Temp 98.4 F (36.9 C) (Oral)   Resp 18   Ht   (1.626 m)   Wt 61.2 kg   SpO2 100%   BMI 23.17 kg/m   Physical Exam  Constitutional: She is oriented to person, place, and time. No distress.  HENT:  Head: Atraumatic.  Right Ear: External ear normal.  Left Ear: External ear normal.  Nose: Nose normal.  Eyes: Conjunctivae are normal. No scleral icterus.  Cardiovascular: Normal rate, regular rhythm and normal heart sounds.   Pulmonary/Chest: Effort normal and breath sounds normal. No respiratory distress. She has no wheezes. She has no rales. She exhibits no tenderness.  Abdominal: She exhibits no distension.  Musculoskeletal:  No midline back tenderness  Neurological: She is alert and oriented to person, place, and time.  Skin: Skin is warm and dry. She is not diaphoretic.  Psychiatric: Her affect is blunt. She is withdrawn. She exhibits a depressed mood.  tearful  Nursing note and vitals reviewed.   ED Course  Procedures   Results for orders placed or performed during the hospital encounter of 09/04/15  Comprehensive metabolic panel  Result Value Ref Range   Sodium 140 135 - 145 mmol/L   Potassium 3.8 3.5 - 5.1 mmol/L   Chloride 106 101 - 111 mmol/L   CO2 24 22 - 32 mmol/L   Glucose, Bld 74 65 - 99 mg/dL   BUN 6 6 - 20 mg/dL   Creatinine, Ser 1.61 0.44 - 1.00 mg/dL   Calcium 9.3 8.9 - 09.6 mg/dL  Total Protein 7.1 6.5 - 8.1 g/dL   Albumin 4.1 3.5 - 5.0 g/dL   AST 19 15 - 41 U/L   ALT 15 14 - 54 U/L   Alkaline Phosphatase 64 38 - 126 U/L   Total Bilirubin 0.8 0.3 - 1.2 mg/dL   GFR calc non Af Amer >60 >60 mL/min   GFR calc Af Amer >60 >60 mL/min   Anion gap 10 5 - 15  CBC with Diff  Result Value Ref Range   WBC 6.4 4.0 - 10.5 K/uL   RBC 5.11 3.87 - 5.11 MIL/uL   Hemoglobin 14.5 12.0 - 15.0 g/dL   HCT 16.144.9 09.636.0 - 04.546.0 %   MCV 87.9 78.0 - 100.0 fL   MCH 28.4 26.0 - 34.0 pg   MCHC 32.3 30.0 - 36.0 g/dL   RDW 40.912.9 81.111.5 - 91.415.5 %   Platelets 237 150 - 400 K/uL   Neutrophils Relative % 52 %   Neutro Abs  3.4 1.7 - 7.7 K/uL   Lymphocytes Relative 31 %   Lymphs Abs 2.0 0.7 - 4.0 K/uL   Monocytes Relative 14 %   Monocytes Absolute 0.9 0.1 - 1.0 K/uL   Eosinophils Relative 2 %   Eosinophils Absolute 0.1 0.0 - 0.7 K/uL   Basophils Relative 1 %   Basophils Absolute 0.0 0.0 - 0.1 K/uL  I-stat troponin, ED  Result Value Ref Range   Troponin i, poc 0.04 0.00 - 0.08 ng/mL   Comment 3           Dg Chest 2 View  Result Date: 09/04/2015 CLINICAL DATA:  Intermittent left-sided chest tightness for months. EXAM: CHEST  2 VIEW COMPARISON:  06/14/2012 FINDINGS: The cardiomediastinal silhouette is within normal limits. The lungs are well inflated and clear. There is no evidence of pleural effusion or pneumothorax. No acute osseous abnormality is identified. IMPRESSION: No active cardiopulmonary disease. Electronically Signed   By: Sebastian AcheAllen  Grady M.D.   On: 09/04/2015 09:39     MDM Pt is medically clear and TTS consult will be placed. Case management has been in to see pt to provide primary care resources. Pt's low back pain appears musculoskeletal with no focal tenderness or neurologic deficits. Her chest pain is chronic, intermittent, and likely related to stress. I have a very low suspicion for ACS, PE, dissection, or other acute cardiopulmonary etiology at this time. She has no chest pain at this time.       Carlene CoriaSerena Y Kane Kusek, PA-C 09/04/15 1001    Arby BarretteMarcy Pfeiffer, MD 09/04/15 1348   ADDENDUM I spoke with TTS. Pt actually will be discharged with recommendations for outpatient follow up. No inpatient tx necessary. Resource guide given. ER return precautions given.    Carlene CoriaSerena Y Taiyo Kozma, PA-C 09/04/15 1403    Arby BarretteMarcy Pfeiffer, MD 09/04/15 [phone]

## 2015-09-04 NOTE — ED Notes (Addendum)
Pt can be discharged-- but family took phone/belongings and locked in car in parking lot. Pt's family not going to return until 1730 for visiting hours.

## 2015-09-04 NOTE — ED Triage Notes (Signed)
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 States took Ibuprofen 1 hr PTA.

## 2015-09-04 NOTE — Discharge Instructions (Signed)
Outpatient Psychiatry and Counseling  [facility]: Mobile Crisis Management 24 hours:  [phone]  [facility] sliding scale fee and walk in schedule: M-F 8am-12pm/1pm-3pm [address]  [place], [place] [address] [phone]  [facility] [address] [place], [place] [address] [phone]  [facility] ([facility])- new patient walk-in appointments available Monday - Friday 8am -3pm.          [address] [place], [place] [address] [phone] or crisis line- [phone]  [facility]/ Intensive Outpatient Therapy Program [address] [place], [place] [address] [phone]  [facility]                  Crisis Services      [phone]      [address]     [place], [place] [address]                 [facility]   [facility] [phone] [address] [place], [place] [address]   [facility]          [address],  [place], [place] [address]       [phone]  [facility] [address] [place], [place] [address] [phone]  [facility]    [address]    [place], [place] [address]     [phone]       Parish McKinney, MD     [address]     [place] [address]     [phone]       [facility] [address] [place] [address]  [facility]     [address]     [place], [place]      [phone]       [facility] Shamsher Ahluwalia, MD [address] [place], [place] [address] [phone]  [facility]     [address]     [place], [place] [address]     [phone]       [facility] [address] [place], [place] [address] [phone] Resources for Temporary  Residential Assistance/Crisis Centers  [facility]) M-F 8am-3pm   [address] [place], [place] [address]   [phone] Services include: laundry, barbering, support groups, case management, phone  & computer access, showers, AA/NA mtgs, mental health/substance abuse nurse, job skills class, disability information, VA assistance, spiritual classes, etc.   HOMELESS SHELTERS  [facility]     [facility]   [address], [place]     [phone]              [facility] (women and children)       [address] [place], [place] [address] [phone] [email or web address] for application and process Application Required  [facility]   [address]    [place] [address]     [phone]                    [facility] [address] [place], [place] [address] [phone](schedule application appt.) Application Required  [facility] (women only)    [address]     [place], [place] [address]       [phone]      Intake starts 6pm daily Need valid ID, SSC, & Police report [facility] [place] [address] [place], [place] [phone] Application Required  [facility] (men only)     [address]      [place], [place]     [phone]       [facility] (Pregnant women only) [address] [place], [place] [phone]  [facility]      [address]      [place], [place] [address]     [phone]             [facility] [facility] [address] [place], [place] [phone] 90 day commitment/SA/Application process  [facility](men only)     [address]     [place], [place]     [phone]       Check-in at 7pm            [facility] [address] [place], [place] [address] [phone] Men/Women/Women and Children  must be there by 7 pm  [facility] [place], [place] [phone]                 

## 2015-09-04 NOTE — ED Notes (Signed)
TTS at bedside. 

## 2015-09-04 NOTE — ED Notes (Signed)
Staffing Office aware of need for sitter and that pt is now in B17.

## 2015-09-04 NOTE — Care Management Note (Signed)
Case Management Note  Patient Details  Name: Haley MayhewDaphne I Turner MRN: [id number] Date of Birth: [birth year]  Subjective/Objective:   55 y.o. F seen in the ED for Back Pain and other Mental Health issues today. CM received CM referral as pt does not have PCP and No Insurance. Pt reports she recently moved to the [place] area where she will be seeking PCP.    Also in process of obtaining Disability status and Medicaid.          Action/Plan: [facility]30 A for open clinic hours for follow up and to get PCP if she remains in the area. Continue with Disability and Medicaid proceedings. No further questions. Will defer to TTS at this time.    Expected Discharge Date:                  Expected Discharge Plan:     In-House Referral:     Discharge planning Services  CM Consult, Indigent Health Clinic  Post Acute Care Choice:    Choice offered to:  Patient  DME Arranged:    DME Agency:     HH Arranged:    HH Agency:     Status of Service:  Completed, signed off  If discussed at [employer] Meetings, dates discussed:    Additional Comments:  Yvone NeuCrutchfield, Deacon Gadbois M, RN 09/04/2015, 9:58 AM

## 2015-09-04 NOTE — ED Notes (Signed)
States was in an inpt psych facility approx 12 years ago and feels she needs inpt again.

## 2017-03-05 DIAGNOSIS — L84 Corns and callosities: Secondary | ICD-10-CM | POA: Insufficient documentation

## 2017-03-05 HISTORY — DX: Corns and callosities: L84

## 2017-08-16 ENCOUNTER — Ambulatory Visit: Payer: Self-pay | Admitting: Internal Medicine

## 2017-08-16 ENCOUNTER — Encounter: Payer: Self-pay | Admitting: Licensed Clinical Social Worker

## 2017-08-16 ENCOUNTER — Telehealth: Payer: Self-pay | Admitting: Licensed Clinical Social Worker

## 2017-08-16 VITALS — BP 138/80 | HR 76 | Resp 12 | Ht 60.0 in | Wt 136.0 lb

## 2017-08-16 DIAGNOSIS — F329 Major depressive disorder, single episode, unspecified: Secondary | ICD-10-CM

## 2017-08-16 DIAGNOSIS — F419 Anxiety disorder, unspecified: Secondary | ICD-10-CM

## 2017-08-16 DIAGNOSIS — K029 Dental caries, unspecified: Secondary | ICD-10-CM

## 2017-08-16 DIAGNOSIS — F32A Depression, unspecified: Secondary | ICD-10-CM

## 2017-08-16 DIAGNOSIS — H524 Presbyopia: Secondary | ICD-10-CM

## 2017-08-16 DIAGNOSIS — L84 Corns and callosities: Secondary | ICD-10-CM

## 2017-08-16 DIAGNOSIS — R739 Hyperglycemia, unspecified: Secondary | ICD-10-CM

## 2017-08-16 DIAGNOSIS — Z716 Tobacco abuse counseling: Secondary | ICD-10-CM

## 2017-08-16 DIAGNOSIS — I214 Non-ST elevation (NSTEMI) myocardial infarction: Secondary | ICD-10-CM

## 2017-08-16 DIAGNOSIS — Z720 Tobacco use: Secondary | ICD-10-CM

## 2017-08-16 LAB — GLUCOSE, POCT (MANUAL RESULT ENTRY): POC Glucose: 120 mg/dl — AB (ref 70–99)

## 2017-08-16 MED ORDER — SERTRALINE HCL 50 MG PO TABS: 50.0000 mg | ORAL_TABLET | Freq: Every day | ORAL | 5 refills | Status: DC

## 2017-08-16 NOTE — Telephone Encounter (Signed)
LCSW called patient in order to introduce self and provide information about social work services available at the clinic. Patient reported that she has no needs at this time.

## 2017-08-22 NOTE — Progress Notes (Signed)
This encounter was created in error - please disregard.

## 2017-09-17 ENCOUNTER — Ambulatory Visit: Payer: Self-pay | Admitting: Internal Medicine

## 2017-09-24 ENCOUNTER — Ambulatory Visit: Payer: Self-pay | Admitting: Internal Medicine

## 2017-10-30 ENCOUNTER — Encounter: Payer: Self-pay | Admitting: Internal Medicine

## 2017-10-30 ENCOUNTER — Ambulatory Visit: Payer: Medicaid Other | Admitting: Internal Medicine

## 2017-10-30 VITALS — BP 128/78 | HR 76 | Resp 13 | Ht 60.0 in | Wt 137.0 lb

## 2017-10-30 DIAGNOSIS — K047 Periapical abscess without sinus: Secondary | ICD-10-CM

## 2017-10-30 DIAGNOSIS — E041 Nontoxic single thyroid nodule: Secondary | ICD-10-CM

## 2017-10-30 DIAGNOSIS — K029 Dental caries, unspecified: Secondary | ICD-10-CM

## 2017-10-30 DIAGNOSIS — Z720 Tobacco use: Secondary | ICD-10-CM | POA: Insufficient documentation

## 2017-10-30 DIAGNOSIS — M7742 Metatarsalgia, left foot: Secondary | ICD-10-CM

## 2017-10-30 MED ORDER — PENICILLIN V POTASSIUM 250 MG PO TABS: 250.0000 mg | ORAL_TABLET | Freq: Four times a day (QID) | ORAL | 0 refills | Status: DC

## 2017-10-30 MED ORDER — TRAZODONE HCL 50 MG PO TABS
25.0000 mg | ORAL_TABLET | Freq: Every evening | ORAL | 11 refills | Status: DC | PRN
Start: 2017-10-30 — End: 2020-03-04

## 2017-10-30 NOTE — Progress Notes (Signed)
   Subjective:    Patient ID: Haley Turner, female    DOB: November 06, 1960, 57 y.o.   MRN: [id number]  HPI   Patient seen by Rodney Cruise, M.D. This is a transcription of her written note. Please refer also to scanned in written note in media tab.  New patient to establish  Anx + depression--early morning awakening-was on meds, ran out in April, they helped.  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  Dental issues--upper back molars--breaking off, food setting off, occ pain, may have had abscess R upper gum -- but right now not severe.  PMHx:  Micah Flesher to hospital for chest pain 03/2017 in [place], [place]--EKG + stress test were (-)ive  PMHx:  "MI"  In 2012--[place]  Had a cath but no bypass or stent. Cardiologist + (?[facility]) not recently Stopped BP or chol meds  (+ smokes--trying to quit)  Current Meds  Medication Sig  . aspirin 81 MG chewable tablet Chew 81 mg by mouth daily.   Allergies  Allergen Reactions  . Sulfonamide Derivatives     REACTION: Pruritic rash      Review of Systems     Objective:   Physical Exam Skin:  No rash. HEENT:  Very poor dentition, broken of molars, slightly tender swelling R maxilla above broken tooth. Neck:  No adenopathy Chest:  Lungs clear, decreased BS's CV:  RRR Abdomen/pelvis:  nontender   No HSM/masses. Extremities:  No LLE--callous/corn plantar surface L foot under 2nd metatarsal head.  CBG:  120       Assessment & Plan:  1.  Tooth abscesses/decay/breakage--dental referral   2.  Vision--glucose ok = 120  Vision referral for new glasses  3.  Painful corn L foot--try inserts to remove pressure from corn; continue using pumus stone to pare down F/U for paring of corns prn here. (Podiatry referral later if indicated)  4.  Anxiety/depression-Restart Sertraline 50 mg 1 po qd  #30/5 RF (at f/u, can decide if needs trazodone 50 which she was  taking for sleep)  5.  Smoking--work on [employer], patches.  Followup in 1 month to recheck depression, foot pain, smoking  6.  CAD--needs [place], check cholesterol, restart on statin.

## 2017-10-30 NOTE — Progress Notes (Signed)
   Subjective:    Patient ID: Haley Turner, female    DOB: April 10, 1960, 57 y.o.   MRN: [id number]  HPI 1.  Depression and anxiety:  Did not feel she really needed the Sertraline when seen in July.  Her bigger problem is sleep  2.  Insomnia:  Goes to bed at 10 p.m.   Walks with the kids daily.   Dances, sings, and walks.  Jannetta Quint. Spends time after 5 p.m. To be with her family and do her own thing.  Au pair currently for twins in [place].  57 yo twin.  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  Had signficiant depression then.   Was in [place] when had breakdown.  3.  Second ray MT head:  Has been told she needs to have surgery for this ray.  Also with hammertoes on left foot.  Used to wear heels.     4.  Dental decay--cutting tongue. Lump above upper right tooth Review of Systems     Objective:   Physical Exam  NAD HEENT: Right upper premolar with tenderness of surrounding gingiva.  No fluctuance.  Mildy erythematous. Neck:  Supple, No adenopathy, firm enlarged nodular thyroid.   Lungs:  CTA CV:  RRR without murmur or rub.  Radial and DP pulses normal and equal Feet:  Hammertoes with callousing at all IP joints, left toes.  Thick callous over MT head of 2nd MT  Tender.      Assessment & Plan:  1.  Dental pain/abscess:  Penicillin 250 mg 4 times daily for 7 days. Urgent dental referral  2.  Left 2nd MT head tenderness/hammertoes with callousing:  Podiatry referral.  3.  Nodular thyroid:  TSH with fasting labs in 1 month.  4.  Insomnia:  Trazodone 25-50 mg at bedtime

## 2017-11-17 ENCOUNTER — Emergency Department (HOSPITAL_COMMUNITY)
Admission: EM | Admit: 2017-11-17 | Discharge: 2017-11-17 | Disposition: A | Discharge status: Home / Self Care | Payer: Self-pay | Attending: Emergency Medicine | Admitting: Emergency Medicine

## 2017-11-17 ENCOUNTER — Encounter (HOSPITAL_COMMUNITY): Payer: Self-pay | Admitting: Emergency Medicine

## 2017-11-17 DIAGNOSIS — H02824 Cysts of left upper eyelid: Secondary | ICD-10-CM | POA: Insufficient documentation

## 2017-11-17 DIAGNOSIS — Z7982 Long term (current) use of aspirin: Secondary | ICD-10-CM | POA: Insufficient documentation

## 2017-11-17 DIAGNOSIS — F17210 Nicotine dependence, cigarettes, uncomplicated: Secondary | ICD-10-CM | POA: Insufficient documentation

## 2017-11-17 DIAGNOSIS — Z79899 Other long term (current) drug therapy: Secondary | ICD-10-CM | POA: Insufficient documentation

## 2017-11-17 DIAGNOSIS — L0201 Cutaneous abscess of face: Secondary | ICD-10-CM

## 2017-11-17 MED ORDER — MUPIROCIN CALCIUM 2 % EX CREA: 1.0000 "application " | TOPICAL_CREAM | Freq: Two times a day (BID) | CUTANEOUS | 0 refills | Status: DC

## 2017-11-17 NOTE — Discharge Instructions (Signed)
Please apply warm compress to affected area several times daily.  In between warm compresses, apply mupirocin cream twice daily.  Follow-up with your doctor for further care.

## 2017-11-17 NOTE — ED Provider Notes (Signed)
[facility] EMERGENCY DEPARTMENT Provider Note   CSN: [id number] Arrival date & time: 11/17/17  0827     History   Chief Complaint Chief Complaint  Patient presents with  . Eye Pain    HPI Haley Turner is a 57 y.o. female.  The history is provided by the patient. No language interpreter was used.  Eye Pain      57 year old female presented complaining of pain and swelling to her left upper eyelid.  Patient reports several months ago she noticed a small bump on her left upper eyelid.  It was not tender at that time.  It has been waxing waning size however it became more painful last night.  She tries using eyedrops with minimal improvement.  She denies any associated fever, recent URI symptoms, ear pain vision changes of pain with eye movement.  She reports pain is 8 out of 10 and noticed that her eyelid was swollen.      Past Medical History:  Diagnosis Date  . Acute myopericarditis   . Depression   . Pre-ulcerative corn or callous 03/05/2017   debrided, left foot, sub 2nd MT head:  in [place], [place] clinic:  [facility]  . Suicide ideation     Patient Active Problem List   Diagnosis Date Noted  . Tobacco abuse 10/30/2017  . Pre-ulcerative corn or callous 03/05/2017  . Acute myopericarditis 03/13/2011  . Hyperlipidemia 10/25/2009  . GONORRHEA 09/23/2009  . 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 PANIC DISORDER 09/23/2009  . Depression 09/23/2009  . CARPAL TUNNEL SYNDROME, LEFT 09/23/2009  . 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 CHEST PAIN 09/23/2009  . CHLAMYDIAL INFECTION, HX OF 09/23/2009    Past Surgical History:  Procedure Laterality Date  . CARDIAC CATHETERIZATION    . LEFT HEART CATHETERIZATION WITH CORONARY ANGIOGRAM N/A 03/12/2011   Procedure: LEFT HEART CATHETERIZATION WITH CORONARY ANGIOGRAM;  Surgeon: Corky Crafts, MD;  [facility] LAB;  Service: Cardiovascular;  Laterality: N/A;  . NO PAST SURGERIES       OB History     None      Home Medications    Prior to Admission medications   Medication Sig Start Date End Date Taking? Authorizing Provider  aspirin 81 MG chewable tablet Chew 81 mg by mouth daily.    [provider]  penicillin v potassium (VEETID) 250 MG tablet Take 1 tablet (250 mg total) by mouth 4 (four) times daily. 10/30/17   Julieanne Manson, MD  sertraline (ZOLOFT) 50 MG tablet Take 1 tablet (50 mg total) by mouth daily. Patient not taking: Reported on 10/30/2017 08/16/17   Julieanne Manson, MD  traZODone (DESYREL) 50 MG tablet Take 0.5-1 tablets (25-50 mg total) by mouth at bedtime as needed for sleep. 10/30/17   Julieanne Manson, MD    Family History No family history on file.  Social History Social History   Tobacco Use  . Smoking status: Current Every Day Smoker    Packs/day: 0.50    Types: Cigarettes    Last attempt to quit: 01/29/2012    Years since quitting: 5.8  . Smokeless tobacco: Never Used  Substance Use Topics  . Alcohol use: Yes    Alcohol/week: 1.0 standard drinks    Types: 1 Shots of liquor per week    Comment: Daily  . Drug use: No     Allergies   Sulfonamide derivatives   Review of Systems Review of Systems  Eyes: Negative for pain, discharge and  visual disturbance.  Skin: Positive for rash.     Physical Exam Updated Vital Signs BP (!) 156/93   Pulse 77   Temp 98.1 F (36.7 C)   Resp 15   SpO2 100%   Physical Exam  Constitutional: She appears well-developed and well-nourished. No distress.  HENT:  Head: Atraumatic.  Superior to left upper eyelid is a epidermal cystic lesion approximately 4 mm in diameter with surrounding mild erythema and edema extending towards the upper eyelid.  It is tender to palpation.  Eyes: Pupils are equal, round, and reactive to light. Conjunctivae and EOM are normal.  Neck: Neck supple.  Neurological: She is alert.  Skin: No rash noted.  Psychiatric: She has a normal mood and affect.  Nursing  note and vitals reviewed.    ED Treatments / Results  Labs (all labs ordered are listed, but only abnormal results are displayed) Labs Reviewed - No data to display  EKG None  Radiology No results found.  Procedures Procedures (including critical care time)  Medications Ordered in ED Medications - No data to display   Initial Impression / Assessment and Plan / ED Course  I have reviewed the triage vital signs and the nursing notes.  Pertinent labs & imaging results that were available during my care of the patient were reviewed by me and considered in my medical decision making (see chart for details).     BP (!) 156/93   Pulse 77   Temp 98.1 F (36.7 C)   Resp 15   SpO2 100%    Final Clinical Impressions(s) / ED Diagnoses   Final diagnoses:  Cutaneous abscess of face    ED Discharge Orders         Ordered    mupirocin cream (BACTROBAN) 2 %  2 times daily     11/17/17 0924         9:21 AM Patient has a small cystic lesion above her left eyelid but inferior to her eyebrow.  It is nearly coming to a head.  It is tender to palpation with mild surrounding edema.  There is no ocular involvement.  No evidence of preseptal cellulitis or orbital cellulitis.  This is not consistent with a chalazion or hordeolum.  Encourage patient to use warm compress, and apply mupirocin cream and follow-up with PCP for further care.   Fayrene Helper, PA-C 11/17/17 1610    Tilden Fossa, MD 11/17/17 [phone]

## 2017-11-17 NOTE — ED Triage Notes (Signed)
Pt reports swelling to her upper L eyelid, saw pcp but was not given any meds, states that she began having pain in eyelid last night

## 2017-12-03 ENCOUNTER — Other Ambulatory Visit: Payer: Medicaid Other

## 2017-12-03 DIAGNOSIS — Z13220 Encounter for screening for lipoid disorders: Secondary | ICD-10-CM

## 2017-12-03 DIAGNOSIS — Z79899 Other long term (current) drug therapy: Secondary | ICD-10-CM

## 2017-12-03 DIAGNOSIS — E010 Iodine-deficiency related diffuse (endemic) goiter: Secondary | ICD-10-CM

## 2017-12-04 LAB — COMPREHENSIVE METABOLIC PANEL
A/G RATIO: 2 (ref 1.2–2.2)
ALT: 12 IU/L (ref 0–32)
AST: 18 IU/L (ref 0–40)
Albumin: 4.7 g/dL (ref 3.5–5.5)
Alkaline Phosphatase: 77 IU/L (ref 39–117)
BILIRUBIN TOTAL: 0.4 mg/dL (ref 0.0–1.2)
BUN/Creatinine Ratio: 15 (ref 9–23)
BUN: 10 mg/dL (ref 6–24)
CHLORIDE: 101 mmol/L (ref 96–106)
CO2: 24 mmol/L (ref 20–29)
Calcium: 9.2 mg/dL (ref 8.7–10.2)
Creatinine, Ser: 0.66 mg/dL (ref 0.57–1.00)
GFR calc non Af Amer: 98 mL/min/{1.73_m2} (ref >59)
GFR, EST AFRICAN AMERICAN: 113 mL/min/{1.73_m2} (ref >59)
GLUCOSE: 87 mg/dL (ref 65–99)
Globulin, Total: 2.3 g/dL (ref 1.5–4.5)
POTASSIUM: 4.7 mmol/L (ref 3.5–5.2)
Sodium: 144 mmol/L (ref 134–144)
TOTAL PROTEIN: 7 g/dL (ref 6.0–8.5)

## 2017-12-04 LAB — CBC WITH DIFFERENTIAL/PLATELET
BASOS ABS: 0.1 10*3/uL (ref 0.0–0.2)
Basos: 1 %
EOS (ABSOLUTE): 0.1 10*3/uL (ref 0.0–0.4)
Eos: 2 %
Hematocrit: 42.7 % (ref 34.0–46.6)
Hemoglobin: 13.7 g/dL (ref 11.1–15.9)
IMMATURE GRANS (ABS): 0 10*3/uL (ref 0.0–0.1)
IMMATURE GRANULOCYTES: 0 %
LYMPHS: 46 %
Lymphocytes Absolute: 2.3 10*3/uL (ref 0.7–3.1)
MCH: 27.9 pg (ref 26.6–33.0)
MCHC: 32.1 g/dL (ref 31.5–35.7)
MCV: 87 fL (ref 79–97)
Monocytes Absolute: 0.6 10*3/uL (ref 0.1–0.9)
Monocytes: 12 %
NEUTROS PCT: 39 %
Neutrophils Absolute: 2 10*3/uL (ref 1.4–7.0)
PLATELETS: 224 10*3/uL (ref 150–450)
RBC: 4.91 x10E6/uL (ref 3.77–5.28)
RDW: 12.6 % (ref 12.3–15.4)
WBC: 5.1 10*3/uL (ref 3.4–10.8)

## 2017-12-04 LAB — TSH: TSH: 1.57 u[IU]/mL (ref 0.450–4.500)

## 2017-12-04 LAB — LIPID PANEL W/O CHOL/HDL RATIO
Cholesterol, Total: 193 mg/dL (ref 100–199)
HDL: 59 mg/dL (ref >39)
LDL CALC: 120 mg/dL — AB (ref 0–99)
TRIGLYCERIDES: 71 mg/dL (ref 0–149)
VLDL CHOLESTEROL CAL: 14 mg/dL (ref 5–40)

## 2017-12-30 ENCOUNTER — Ambulatory Visit: Payer: Self-pay

## 2018-01-06 ENCOUNTER — Ambulatory Visit: Payer: Self-pay | Attending: Family Medicine | Admitting: Podiatry

## 2018-01-06 DIAGNOSIS — M2042 Other hammer toe(s) (acquired), left foot: Secondary | ICD-10-CM

## 2018-01-06 DIAGNOSIS — L84 Corns and callosities: Secondary | ICD-10-CM

## 2018-01-06 DIAGNOSIS — M2041 Other hammer toe(s) (acquired), right foot: Secondary | ICD-10-CM

## 2018-01-09 NOTE — Progress Notes (Signed)
Subjective:   Patient ID: Haley Turner, female   DOB: 57 y.o.   MRN: [id number]   HPI 57 year old female presents the office today for concerns of a skin lesion bilaterally left side worse than the right she points along the submetatarsal 2 area.  She has a history of hammertoe surgery on the right foot but not on the left foot.  She said the area is painful at times with pressure.  She has occasional tingling and numbness of the area with pressure but no shooting or radiating pain.  She had no recent treatment.  She has no other concerns.   Review of Systems  All other systems reviewed and are negative.  Past Medical History:  Diagnosis Date  . Acute myopericarditis   . Depression   . Pre-ulcerative corn or callous 03/05/2017   debrided, left foot, sub 2nd MT head[place]  [place]:  [facility]  . Suicide ideation     Past Surgical History:  Procedure Laterality Date  . CARDIAC CATHETERIZATION    . LEFT HEART CATHETERIZATION WITH CORONARY ANGIOGRAM N/A 03/12/2011   Procedure: LEFT HEART CATHETERIZATION WITH CORONARY ANGIOGRAM;  Surgeon: Corky CraftsJayadeep S Varanasi, MD;  [facility];  Service: Cardiovascular;  Laterality: N/A;  . NO PAST SURGERIES       Current Outpatient Medications:  .  aspirin 81 MG chewable tablet, Chew 81 mg by mouth daily., Disp: , Rfl:  .  Multiple Vitamins-Minerals (MULTIVITAMIN WITH MINERALS) tablet, Take 1 tablet by mouth daily., Disp: , Rfl:  .  mupirocin cream (BACTROBAN) 2 %, Apply 1 application topically 2 (two) times daily., Disp: 15 g, Rfl: 0 .  penicillin v potassium (VEETID) 250 MG tablet, Take 1 tablet (250 mg total) by mouth 4 (four) times daily. (Patient not taking: Reported on 11/17/2017), Disp: 28 tablet, Rfl: 0 .  sertraline (ZOLOFT) 50 MG tablet, Take 1 tablet (50 mg total) by mouth daily. (Patient not taking: Reported on 10/30/2017), Disp: 30 tablet, Rfl: 5 .  traZODone (DESYREL) 50 MG tablet, Take 0.5-1  tablets (25-50 mg total) by mouth at bedtime as needed for sleep. (Patient not taking: Reported on 11/17/2017), Disp: 30 tablet, Rfl: 11  Allergies  Allergen Reactions  . Sulfonamide Derivatives     REACTION: Pruritic rash        Objective:  Physical Exam  General: AAO x3, NAD  Dermatological: Hyperkeratotic tissue present bilateral semi-tarsal to the left side worse than the right.  No underlying ulceration, drainage or any signs of infection present.  Vascular: Dorsalis Pedis artery and Posterior Tibial artery pedal pulses are 2/4 bilateral with immedate capillary fill time. There is no pain with calf compression, swelling, warmth, erythema.   Neruologic: Grossly intact via light touch bilateral.  Protective threshold with Semmes Wienstein monofilament intact to all pedal sites bilateral.   Musculoskeletal: Hammertoes present on the left foot worse than the right.  Problem of the metatarsal head.  Bunion deformity present left foot.  Muscular strength 5/5 in all groups tested bilateral.  Gait: Unassisted, Nonantalgic.       Assessment:   Hyperkeratotic lesions due to underlying digital deformity    Plan:  -Treatment options discussed including all alternatives, risks, and complications -Etiology of symptoms were discussed -I started to break the lesions without any complications or bleeding.  Discussed with her various offloading pads.  Discussed with her what to get if she cannot get them to come by the office and I will  be more than happy given to her.  Discussed shoe changes as well as offloading.  Vivi Barrack DPM

## 2018-01-31 ENCOUNTER — Ambulatory Visit: Payer: Medicaid Other | Admitting: Internal Medicine

## 2018-02-10 ENCOUNTER — Ambulatory Visit: Payer: Medicaid Other | Admitting: Internal Medicine

## 2019-08-17 ENCOUNTER — Ambulatory Visit: Payer: Medicaid Other | Attending: Critical Care Medicine

## 2019-08-17 DIAGNOSIS — Z23 Encounter for immunization: Secondary | ICD-10-CM

## 2019-08-17 NOTE — Progress Notes (Signed)
   Covid-19 Vaccination Clinic  Name:  Haley Turner    MRN: [id number] DOB: 1960/04/05  08/17/2019  Haley Turner was observed post Covid-19 immunization for 15 minutes without incident. She was provided with Vaccine Information Sheet and instruction to access the V-Safe system.   Haley Turner was instructed to call 911 with any severe reactions post vaccine: Marland Kitchen Difficulty breathing  . Swelling of face and throat  . A fast heartbeat  . A bad rash all over body  . Dizziness and weakness   Immunizations Administered    Name Date Dose VIS Date Route   Pfizer COVID-19 Vaccine 08/17/2019 12:29 PM 0.3 mL 03/25/2018 Intramuscular   Manufacturer: [employer], [employer]   Lot: [id number]   NDC: [address]-7

## 2019-10-12 ENCOUNTER — Ambulatory Visit: Payer: Self-pay | Admitting: *Deleted

## 2019-10-12 NOTE — Telephone Encounter (Signed)
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 She wants to know if virus is limited to children only- or can adults get it.  Advised patient per CDC: adults can get HFMD through close personal contact. Advised safe practices- washing toys and wash hands often. Monitor for symptoms.  Reason for Disposition  General information question, no triage required and triager able to answer question  Protocols used: INFORMATION ONLY CALL - NO TRIAGE-A-AH

## 2019-10-21 ENCOUNTER — Other Ambulatory Visit: Payer: Medicaid Other

## 2019-12-15 ENCOUNTER — Other Ambulatory Visit: Payer: Medicaid Other

## 2019-12-15 DIAGNOSIS — Z20822 Contact with and (suspected) exposure to covid-19: Secondary | ICD-10-CM

## 2019-12-16 LAB — NOVEL CORONAVIRUS, NAA: SARS-CoV-2, NAA: NOT DETECTED

## 2019-12-16 LAB — SARS-COV-2, NAA 2 DAY TAT

## 2020-03-04 ENCOUNTER — Emergency Department (HOSPITAL_COMMUNITY)
Admission: EM | Admit: 2020-03-04 | Discharge: 2020-03-04 | Disposition: A | Discharge status: Home / Self Care | Payer: Self-pay | Attending: Emergency Medicine | Admitting: Emergency Medicine

## 2020-03-04 ENCOUNTER — Other Ambulatory Visit: Payer: Self-pay

## 2020-03-04 ENCOUNTER — Emergency Department (HOSPITAL_COMMUNITY): Payer: Self-pay

## 2020-03-04 ENCOUNTER — Encounter (HOSPITAL_COMMUNITY): Payer: Self-pay

## 2020-03-04 DIAGNOSIS — R519 Headache, unspecified: Secondary | ICD-10-CM | POA: Insufficient documentation

## 2020-03-04 DIAGNOSIS — R079 Chest pain, unspecified: Secondary | ICD-10-CM | POA: Insufficient documentation

## 2020-03-04 DIAGNOSIS — R29898 Other symptoms and signs involving the musculoskeletal system: Secondary | ICD-10-CM

## 2020-03-04 DIAGNOSIS — R531 Weakness: Secondary | ICD-10-CM | POA: Insufficient documentation

## 2020-03-04 DIAGNOSIS — F17210 Nicotine dependence, cigarettes, uncomplicated: Secondary | ICD-10-CM | POA: Insufficient documentation

## 2020-03-04 DIAGNOSIS — Z7982 Long term (current) use of aspirin: Secondary | ICD-10-CM | POA: Insufficient documentation

## 2020-03-04 DIAGNOSIS — R200 Anesthesia of skin: Secondary | ICD-10-CM | POA: Insufficient documentation

## 2020-03-04 LAB — CBC WITH DIFFERENTIAL/PLATELET
Abs Immature Granulocytes: 0.01 10*3/uL (ref 0.00–0.07)
Basophils Absolute: 0.1 10*3/uL (ref 0.0–0.1)
Basophils Relative: 1 %
Eosinophils Absolute: 0.1 10*3/uL (ref 0.0–0.5)
Eosinophils Relative: 2 %
HCT: 43.4 % (ref 36.0–46.0)
Hemoglobin: 14 g/dL (ref 12.0–15.0)
Immature Granulocytes: 0 %
Lymphocytes Relative: 33 %
Lymphs Abs: 1.7 10*3/uL (ref 0.7–4.0)
MCH: 29 pg (ref 26.0–34.0)
MCHC: 32.3 g/dL (ref 30.0–36.0)
MCV: 89.9 fL (ref 80.0–100.0)
Monocytes Absolute: 0.7 10*3/uL (ref 0.1–1.0)
Monocytes Relative: 14 %
Neutro Abs: 2.6 10*3/uL (ref 1.7–7.7)
Neutrophils Relative %: 50 %
Platelets: 228 10*3/uL (ref 150–400)
RBC: 4.83 MIL/uL (ref 3.87–5.11)
RDW: 13.3 % (ref 11.5–15.5)
WBC: 5.2 10*3/uL (ref 4.0–10.5)
nRBC: 0 % (ref 0.0–0.2)

## 2020-03-04 LAB — BASIC METABOLIC PANEL
Anion gap: 8 (ref 5–15)
BUN: 12 mg/dL (ref 6–20)
CO2: 26 mmol/L (ref 22–32)
Calcium: 9.1 mg/dL (ref 8.9–10.3)
Chloride: 106 mmol/L (ref 98–111)
Creatinine, Ser: 0.64 mg/dL (ref 0.44–1.00)
GFR, Estimated: >60 mL/min (ref >60)
Glucose, Bld: 80 mg/dL (ref 70–99)
Potassium: 4.6 mmol/L (ref 3.5–5.1)
Sodium: 140 mmol/L (ref 135–145)

## 2020-03-04 LAB — TROPONIN I (HIGH SENSITIVITY)
Troponin I (High Sensitivity): 2 ng/L (ref <18)
Troponin I (High Sensitivity): <2 ng/L (ref <18)

## 2020-03-04 MED ORDER — KETOROLAC TROMETHAMINE 30 MG/ML IJ SOLN
30.0000 mg | Freq: Once | INTRAMUSCULAR | Status: AC
  Administered 2020-03-04: 30 mg via INTRAVENOUS
  Filled 2020-03-04: qty 1

## 2020-03-04 NOTE — ED Notes (Signed)
Pt transported to CT ?

## 2020-03-04 NOTE — ED Provider Notes (Signed)
[facility]-EMERGENCY DEPT Provider Note   CSN: [id number] Arrival date & time: 03/04/20  1027     History Chief Complaint  Patient presents with  . Headache    Haley Turner is a 60 y.o. female.  Patient here with multiple complaints.  Left hand feels somewhat weak since then.  Also noticing some numbness in her left axilla.  Feels like her heart is racing and there are some discomfort in her chest.  All this occurred after a stressful episode at work.  This morning she woke up with pain in the back of her head.  That is mostly resolved now.  Was just at her PCPs office and they told her her blood pressure was elevated.  The history is provided by the patient.  Headache Pain location:  Occipital Quality:  Dull Radiates to:  Does not radiate Severity currently:  0/10 Severity at highest:  8/10 Onset quality:  Gradual Timing:  Constant Progression:  Resolved Chronicity:  New Relieved by:  None tried Worsened by:  Nothing Ineffective treatments:  None tried Associated symptoms: focal weakness (??), numbness and weakness   Associated symptoms: no abdominal pain, no back pain, no cough, no diarrhea, no fever, no loss of balance, no nausea, no neck stiffness, no photophobia, no sore throat, no visual change and no vomiting        Past Medical History:  Diagnosis Date  . Acute myopericarditis   . Depression   . Pre-ulcerative corn or callous 03/05/2017   debrided, left foot, sub 2nd MT head:  in [place], [place] clinic:  [facility]  . Suicide ideation     Patient Active Problem List   Diagnosis Date Noted  . Tobacco abuse 10/30/2017  . Pre-ulcerative corn or callous 03/05/2017  . Acute myopericarditis 03/13/2011  . Hyperlipidemia 10/25/2009  . GONORRHEA 09/23/2009  . 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 PANIC DISORDER 09/23/2009  . Depression 09/23/2009  . CARPAL TUNNEL SYNDROME, LEFT 09/23/2009  . 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 CHEST PAIN  09/23/2009  . CHLAMYDIAL INFECTION, HX OF 09/23/2009    Past Surgical History:  Procedure Laterality Date  . CARDIAC CATHETERIZATION    . LEFT HEART CATHETERIZATION WITH CORONARY ANGIOGRAM N/A 03/12/2011   Procedure: LEFT HEART CATHETERIZATION WITH CORONARY ANGIOGRAM;  Surgeon: Corky Crafts, MD;  Location[facility] C[facility] LAB;  Service: Cardiovascular;  Laterality: N/A;  . NO PAST SURGERIES       OB History   No obstetric history on file.     History reviewed. No pertinent family history.  Social History   Tobacco Use  . Smoking status: Current Every Day Smoker    Packs/day: 0.50    Types: Cigarettes    Last attempt to quit: 01/29/2012    Years since quitting: 8.1  . Smokeless tobacco: Never Used  Substance Use Topics  . Alcohol use: Yes    Alcohol/week: 1.0 standard drink    Types: 1 Shots of liquor per week    Comment: Daily  . Drug use: No    Home Medications Prior to Admission medications   Medication Sig Start Date End Date Taking? Authorizing Provider  aspirin 81 MG chewable tablet Chew 81 mg by mouth daily.    [provider]  Multiple Vitamins-Minerals (MULTIVITAMIN WITH MINERALS) tablet Take 1 tablet by mouth daily.    [provider]  mupirocin cream (BACTROBAN) 2 % Apply 1 application topically 2 (two) times daily. 11/17/17   Fayrene Helper, PA-C  penicillin  v potassium (VEETID) 250 MG tablet Take 1 tablet (250 mg total) by mouth 4 (four) times daily. Patient not taking: Reported on 11/17/2017 10/30/17   Julieanne Manson, MD  sertraline (ZOLOFT) 50 MG tablet Take 1 tablet (50 mg total) by mouth daily. Patient not taking: Reported on 10/30/2017 08/16/17   Julieanne Manson, MD  traZODone (DESYREL) 50 MG tablet Take 0.5-1 tablets (25-50 mg total) by mouth at bedtime as needed for sleep. Patient not taking: Reported on 11/17/2017 10/30/17   Julieanne Manson, MD    Allergies    Sulfonamide derivatives  Review of Systems   Review of Systems   Constitutional: Negative for fever.  HENT: Negative for sore throat.   Eyes: Negative for photophobia.  Respiratory: Negative for cough.   Cardiovascular: Positive for chest pain and palpitations.  Gastrointestinal: Negative for abdominal pain, diarrhea, nausea and vomiting.  Genitourinary: Negative for dysuria.  Musculoskeletal: Negative for back pain and neck stiffness.  Skin: Negative for rash.  Neurological: Positive for focal weakness (??), weakness, numbness and headaches. Negative for loss of balance.    Physical Exam Updated Vital Signs BP (!) 156/85 (BP Location: Right Arm)   Pulse 76   Temp 99.3 F (37.4 C) (Oral)   Resp 18   Ht 5' 3.5" (1.613 m)   Wt 62.1 kg   SpO2 99%   BMI 23.89 kg/m   Physical Exam Vitals and nursing note reviewed.  Constitutional:      General: She is not in acute distress.    Appearance: She is well-developed and well-nourished.  HENT:     Head: Normocephalic and atraumatic.  Eyes:     Extraocular Movements: Extraocular movements intact.     Conjunctiva/sclera: Conjunctivae normal.     Pupils: Pupils are equal, round, and reactive to light.  Cardiovascular:     Rate and Rhythm: Normal rate and regular rhythm.     Heart sounds: No murmur heard.   Pulmonary:     Effort: Pulmonary effort is normal. No respiratory distress.     Breath sounds: Normal breath sounds.  Abdominal:     Palpations: Abdomen is soft.     Tenderness: There is no abdominal tenderness.  Musculoskeletal:        General: No swelling, tenderness or edema. Normal range of motion.     Cervical back: Neck supple.  Skin:    General: Skin is warm and dry.     Capillary Refill: Capillary refill takes less than 2 seconds.  Neurological:     Mental Status: She is alert and oriented to person, place, and time.     GCS: GCS eye subscore is 4. GCS verbal subscore is 5. GCS motor subscore is 6.     Cranial Nerves: No cranial nerve deficit, dysarthria or facial asymmetry.      Sensory: No sensory deficit.     Motor: No weakness.     Gait: Gait normal.  Psychiatric:        Mood and Affect: Mood and affect normal.     ED Results / Procedures / Treatments   Labs (all labs ordered are listed, but only abnormal results are displayed) Labs Reviewed  BASIC METABOLIC PANEL  CBC WITH DIFFERENTIAL/PLATELET  TROPONIN I (HIGH SENSITIVITY)  TROPONIN I (HIGH SENSITIVITY)    EKG EKG Interpretation  Date/Time:  Friday March 04 2020 08:48:12 EST Ventricular Rate:  67 PR Interval:    QRS Duration: 78 QT Interval:  390 QTC Calculation: 412 R Axis:  33 Text Interpretation: Sinus rhythm ST elevation suggests acute pericarditis No significant change since last tracing Confirmed by Meridee Score [phone]) on 03/04/2020 9:08:26 AM   Radiology CT Head Wo Contrast  Result Date: 03/04/2020 CLINICAL DATA:  Awoke with posterior headache today LEFT hand weakness, question stroke EXAM: CT HEAD WITHOUT CONTRAST TECHNIQUE: Contiguous axial images were obtained from the base of the skull through the vertex without intravenous contrast. Sagittal and coronal MPR images reconstructed from axial data set. COMPARISON:  None FINDINGS: Brain: Normal ventricular morphology. No midline shift or mass effect. Normal appearance of brain parenchyma. No intracranial hemorrhage, mass lesion, or evidence of acute infarction. No extra-axial fluid collections. Vascular: No hyperdense vessels Skull: Normal appearance Sinuses/Orbits: Clear Other: N/A IMPRESSION: Normal exam. Electronically Signed   By: Ulyses Southward M.D.   On: 03/04/2020 08:43   DG Chest Port 1 View  Result Date: 03/04/2020 CLINICAL DATA:  Chest pain into LEFT shoulder, headache at posterior head, hypertension, smoker EXAM: PORTABLE CHEST 1 VIEW COMPARISON:  Portable exam 0835 hours compared to 09/04/2015 FINDINGS: Normal heart size, mediastinal contours, and pulmonary vascularity. Lungs clear. No infiltrate, pleural effusion, or  pneumothorax. Minimal atherosclerotic calcification aortic arch. RIGHT glenohumeral degenerative changes. IMPRESSION: No acute abnormalities. Aortic Atherosclerosis (ICD10-I70.0). Electronically Signed   By: Ulyses Southward M.D.   On: 03/04/2020 08:45    Procedures Procedures   Medications Ordered in ED Medications  ketorolac (TORADOL) 30 MG/ML injection 30 mg (30 mg Intravenous Given 03/04/20 1040)    ED Course  I have reviewed the triage vital signs and the nursing notes.  Pertinent labs & imaging results that were available during my care of the patient were reviewed by me and considered in my medical decision making (see chart for details).  Clinical Course as of 03/05/20 1114  Fri Mar 04, 2020  4034 Chest x-ray and head CT ordered and interpreted by me as no acute findings. [MB]  1120 Patient received some Toradol improvement in her headache and shoulder pain.  Lab work-up CT imaging of head and chest x-ray unremarkable.  Reviewed with patient she is comfortable plan for likely discharge after delta troponin resulted [MB]    Clinical Course User Index [MB] Terrilee Files, MD   MDM Rules/Calculators/A&P                         This patient complains of posterior headache, vague chest pain, numbness in left axilla, some vague clumsiness in her left hand; this involves an extensive number of treatment Options and is a complaint that carries with it a high risk of complications and Morbidity. The differential includes stroke, bleed, anxiety, peripheral neuropathy, metabolic derangement, ACS, hypertensive urgency  I ordered, reviewed and interpreted labs, which included normal white count normal hemoglobin, normal chemistries, flat troponins I ordered medication Toradol with improvement in her symptoms I ordered imaging studies which included CT head and chest x-ray and I independently    visualized and interpreted imaging which showed no acute findings  Previous records obtained and  reviewed in epic no recent admissions  After the interventions stated above, I reevaluated the patient and found symptoms to be much improved.  Recommended close follow-up with her PCP.  Return instructions discussed   Final Clinical Impression(s) / ED Diagnoses Final diagnoses:  Left hand weakness  Acute nonintractable headache, unspecified headache type  Nonspecific chest pain    Rx / DC Orders ED Discharge Orders    None  Terrilee Files, MD 03/05/20 1116

## 2020-03-04 NOTE — ED Triage Notes (Signed)
Pt presents with c/o headache. Pt reports she woke up this morning with a headache in the back of her head.

## 2020-03-04 NOTE — Discharge Instructions (Signed)
You were seen in the emergency department for some weakness in your left hand along with pain in your chest and shoulder and a headache.  You had blood work EKG chest x-ray and a CAT scan of your head that did not show any serious findings.  Please follow-up with your regular doctor.  Return to the emergency department if any worsening or concerning symptoms.

## 2020-07-06 NOTE — Progress Notes (Signed)
Input of information from records received.

## 2020-07-27 NOTE — Progress Notes (Deleted)
Cardiology Office Note   Date:  07/27/2020   ID:  Haley Turner, DOB 03-26-1960, MRN [id number]  PCP:  Patient, No Pcp Per (Inactive)  Cardiologist:   None Referring:  ***  No chief complaint on file.     History of Present Illness: Haley Turner is a 60 y.o. female who presents for evaluation of ***   Cardiac cath in Feb 2013 demonstrated mild coronary plaque. ***   Normal left main coronary artery. Mild atherosclerosis in the LAD and circumflex. Normal right coronary artery. Normal left ventricular systolic function. LVEDP 17 mmHg. Ejection fraction 60 %.   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   debrided, left foot, sub 2nd MT head:  in [place], [place] clinic:  [facility]   Suicide ideation     Past Surgical History:  Procedure Laterality Date   CARDIAC CATHETERIZATION     LEFT HEART CATHETERIZATION WITH CORONARY ANGIOGRAM N/A 03/12/2011   Procedure: LEFT HEART CATHETERIZATION WITH CORONARY ANGIOGRAM;  Surgeon: Corky Crafts, MD;  Location: [facility] CATH LAB;  Service: Cardiovascular;  Laterality: N/A;   NO PAST SURGERIES       Current Outpatient Medications  Medication Sig Dispense Refill   aspirin EC 81 MG tablet Take 81 mg by mouth daily. Swallow whole.     sertraline (ZOLOFT) 50 MG tablet Take 1 tablet (50 mg total) by mouth daily. (Patient not taking: No sig reported) 30 tablet 5   trolamine salicylate (ASPERCREME) 10 % cream Apply 1 application topically as needed for muscle pain.     No current facility-administered medications for this visit.    Allergies:   Sulfonamide derivatives    Social History:  The patient  reports that she has been smoking cigarettes. She has been smoking an average of 0.50 packs per day. She has never used smokeless tobacco. She reports current alcohol use of about 1.0 standard drink of alcohol per week. She reports  that she does not use drugs.   Family History:  The patient's ***family history includes Arthritis in her mother; Breast cancer in her sister; Hypertension in her brother, father, mother, and sister; Prostate cancer in her brother; Thyroid disease in her brother.    ROS:  Please see the history of present illness.   Otherwise, review of systems are positive for {NONE DEFAULTED:18576}.   All other systems are reviewed and negative.    PHYSICAL EXAM: VS:  There were no vitals taken for this visit. , BMI There is no height or weight on file to calculate BMI. GENERAL:  Well appearing HEENT:  Pupils equal round and reactive, fundi not visualized, oral mucosa unremarkable NECK:  No jugular venous distention, waveform within normal limits, carotid upstroke brisk and symmetric, no bruits, no thyromegaly LYMPHATICS:  No cervical, inguinal adenopathy LUNGS:  Clear to auscultation bilaterally BACK:  No CVA tenderness CHEST:  Unremarkable HEART:  PMI not displaced or sustained,S1 and S2 within normal limits, no S3, no S4, no clicks, no rubs, *** murmurs ABD:  Flat, positive bowel sounds normal in frequency in pitch, no bruits, no rebound, no guarding, no midline pulsatile mass, no hepatomegaly, no splenomegaly EXT:  2 plus pulses throughout, no edema, no cyanosis no clubbing SKIN:  No rashes no nodules NEURO:  Cranial nerves II through XII grossly intact, motor grossly intact throughout PSYCH:  Cognitively intact, oriented to person place and  time    EKG:  EKG {ACTION; IS/IS EFE:07121975} ordered today. The ekg ordered today demonstrates ***   Recent Labs: 03/04/2020: BUN 12; Creatinine, Ser 0.64; Hemoglobin 14.0; Platelets 228; Potassium 4.6; Sodium 140    Lipid Panel    Component Value Date/Time   CHOL 193 12/03/2017 0924   TRIG 71 12/03/2017 0924   HDL 59 12/03/2017 0924   CHOLHDL 3.8 03/12/2011 0319   VLDL 16 03/12/2011 0319   LDLCALC 120 (H) 12/03/2017 0924      Wt Readings from  Last 3 Encounters:  03/04/20 137 lb (62.1 kg)  10/30/17 137 lb (62.1 kg)  08/16/17 136 lb (61.7 kg)      Other studies Reviewed: Additional studies/ records that were reviewed today include: ***. Review of the above records demonstrates:  Please see elsewhere in the note.  ***   ASSESSMENT AND PLAN:  ***   Current medicines are reviewed at length with the patient today.  The patient {ACTIONS; HAS/DOES NOT HAVE:19233} concerns regarding medicines.  The following changes have been made:  {PLAN; NO CHANGE:13088:s}  Labs/ tests ordered today include: *** No orders of the defined types were placed in this encounter.    Disposition:   FU with ***    Signed, Rollene Rotunda, MD  07/27/2020 9:53 PM    [facility]

## 2020-07-29 ENCOUNTER — Ambulatory Visit: Payer: Medicaid Other | Admitting: Cardiology

## 2020-09-05 ENCOUNTER — Other Ambulatory Visit: Payer: Self-pay

## 2020-09-05 ENCOUNTER — Ambulatory Visit: Payer: Self-pay | Attending: Family Medicine | Admitting: Podiatry

## 2020-09-05 DIAGNOSIS — Q828 Other specified congenital malformations of skin: Secondary | ICD-10-CM

## 2020-09-05 DIAGNOSIS — L84 Corns and callosities: Secondary | ICD-10-CM

## 2020-09-05 DIAGNOSIS — M79671 Pain in right foot: Secondary | ICD-10-CM

## 2020-09-05 DIAGNOSIS — M2041 Other hammer toe(s) (acquired), right foot: Secondary | ICD-10-CM

## 2020-09-05 DIAGNOSIS — M2042 Other hammer toe(s) (acquired), left foot: Secondary | ICD-10-CM

## 2020-09-05 DIAGNOSIS — M79672 Pain in left foot: Secondary | ICD-10-CM

## 2020-09-17 NOTE — Progress Notes (Signed)
Subjective: Haley Turner presents today referred by [facility] for complaint of corn(s) and calluses of both feet. Condition is longstanding and she has been using over the counter corn/callus remover pads, but discomfort still persists.  Pain is aggravated when weight bearing with and without shoe gear.  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 Swallow whole.     sertraline (ZOLOFT) 50 MG tablet Take 1 tablet (50 mg total) by mouth daily. (Patient not taking: No sig reported) 30 tablet 5   trolamine salicylate (ASPERCREME) 10 % cream Apply 1 application topically as needed for muscle pain.     No current facility-administered medications on file prior to visit.     Allergies  Allergen Reactions   Sulfonamide Derivatives     REACTION: Pruritic rash     Social History   Occupational History    Not on file  Tobacco Use   Smoking status: Every Day    Packs/day: 0.50    Types: Cigarettes    Last attempt to quit: 01/29/2012    Years since quitting: 8.6   Smokeless tobacco: Never  Substance and Sexual Activity   Alcohol use: Yes    Alcohol/week: 1.0 standard drink    Types: 1 Shots of liquor per week    Comment: Daily   Drug use: No   Sexual activity: Yes    Birth control/protection: Condom     Family History  Problem Relation Age of Onset   Arthritis Mother    Hypertension Mother    Hypertension Father    Breast cancer Sister    Hypertension Sister    Hypertension Brother    Prostate cancer Brother    Thyroid disease Brother      Immunization History  Administered Date(s) Administered   PFIZER(Purple Top)SARS-COV-2 Vaccination 08/17/2019   Td 09/23/2009     Objective: Haley Turner is a pleasant 60 y.o. female WD, WN in NAD. AAO x 3.  There were no vitals filed for this visit.  Vascular Examination:  Capillary refill time to digits immediate b/l. Palpable pedal pulses b/l LE. Pedal hair sparse. Lower extremity skin temperature gradient within normal limits. No edema noted b/l lower extremities.  Dermatological Examination: Skin warm and supple b/l lower extremities. No open wounds b/l lower extremities. No interdigital macerations b/l lower extremities.  Toenails 1-5 bilaterally well maintained with adequate length. No erythema, no edema, no drainage, no fluctuance. Hyperkeratotic lesion(s) L 5th toe and R 4th toe.  No erythema, no edema, no drainage, no fluctuance. Porokeratotic lesion(s) submet head 2 left foot. No erythema, no edema, no drainage, no fluctuance.  Musculoskeletal: Normal muscle strength 5/5 to all lower extremity muscle groups bilaterally. Plantarflexed metatarsal(s) 2nd metatarsal head left foot. Hammertoe(s) noted to the 2-5 bilaterally.  Neurological: Protective sensation intact 5/5 intact bilaterally with 10g monofilament b/l.  Vibratory sensation intact b/l.  Assessment: 1. Corns   2. Porokeratosis   3. Pain in both feet   4. Acquired hammertoes of both feet     Plan: -Examined patient. -Discussed use of OTC corn/callus remover. Recommended use of pumice stone to minimize lesion development. -Patient to continue soft, supportive shoe gear daily. -Corn(s) L 5th toe and R 4th toe pared utilizing sterile scalpel blade without complication or incident. Total number debrided=2. -Painful porokeratotic lesion(s) submet head 2 left foot pared and enucleated with sterile scalpel blade. Pinpoint bleeding addressed with pressure and light dressing applied. She is to apply Neosporin once daily for one week. Patient related understanding. Total number of lesions debrided=1. -Patient to report any pedal injuries to medical professional immediately. -Patient/POA to call should there be question/concern in the interim.  Return if symptoms worsen or fail to improve.  Freddie Breech, DPM

## 2020-09-26 NOTE — Progress Notes (Deleted)
Cardiology Office Note   Date:  09/26/2020   ID:  Haley Turner, DOB [birth year], MRN [id number]  PCP:  Patient, No Pcp Per (Inactive)  Cardiologist:   None Referring:  ***  No chief complaint on file.     History of Present Illness: Haley Turner is a 60 y.o. female who is referred by *** for evaluation of ***.    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   debrided, left foot, sub 2nd MT head:  in [place], [place] clinic:  [facility]   Suicide ideation     Past Surgical History:  Procedure Laterality Date   CARDIAC CATHETERIZATION     LEFT HEART CATHETERIZATION WITH CORONARY ANGIOGRAM N/A 03/12/2011   Procedure: LEFT HEART CATHETERIZATION WITH CORONARY ANGIOGRAM;  Surgeon: Corky Crafts, MD;  Location: [facility] CATH LAB;  Service: Cardiovascular;  Laterality: N/A;   NO PAST SURGERIES       Current Outpatient Medications  Medication Sig Dispense Refill   aspirin EC 81 MG tablet Take 81 mg by mouth daily. Swallow whole.     sertraline (ZOLOFT) 50 MG tablet Take 1 tablet (50 mg total) by mouth daily. (Patient not taking: No sig reported) 30 tablet 5   trolamine salicylate (ASPERCREME) 10 % cream Apply 1 application topically as needed for muscle pain.     No current facility-administered medications for this visit.    Allergies:   Sulfonamide derivatives    Social History:  The patient  reports that she has been smoking cigarettes. She has been smoking an average of .5 packs per day. She has never used smokeless tobacco. She reports current alcohol use of about 1.0 standard drink per week. She reports that she does not use drugs.   Family History:  The patient's ***family history includes Arthritis in her mother; Breast cancer in her sister; Hypertension in her brother, father, mother, and sister; Prostate cancer in her brother; Thyroid disease in her brother.     ROS:  Please see the history of present illness.   Otherwise, review of systems are positive for {NONE DEFAULTED:18576}.   All other systems are reviewed and negative.    PHYSICAL EXAM: VS:  There were no vitals taken for this visit. , BMI There is no height or weight on file to calculate BMI. GENERAL:  Well appearing HEENT:  Pupils equal round and reactive, fundi not visualized, oral mucosa unremarkable NECK:  No jugular venous distention, waveform within normal limits, carotid upstroke brisk and symmetric, no bruits, no thyromegaly LYMPHATICS:  No cervical, inguinal adenopathy LUNGS:  Clear to auscultation bilaterally BACK:  No CVA tenderness CHEST:  Unremarkable HEART:  PMI not displaced or sustained,S1 and S2 within normal limits, no S3, no S4, no clicks, no rubs, *** murmurs ABD:  Flat, positive bowel sounds normal in frequency in pitch, no bruits, no rebound, no guarding, no midline pulsatile mass, no hepatomegaly, no splenomegaly EXT:  2 plus pulses throughout, no edema, no cyanosis no clubbing SKIN:  No rashes no nodules NEURO:  Cranial nerves II through XII grossly intact, motor grossly intact throughout PSYCH:  Cognitively intact, oriented to person place and time    EKG:  EKG {ACTION; IS/IS QMG:50037048} ordered today. The ekg ordered today demonstrates ***   Recent Labs: 03/04/2020: BUN 12; Creatinine, Ser 0.64; Hemoglobin 14.0; Platelets 228; Potassium 4.6; Sodium 140    Lipid  Panel    Component Value Date/Time   CHOL 193 12/03/2017 0924   TRIG 71 12/03/2017 0924   HDL 59 12/03/2017 0924   CHOLHDL 3.8 03/12/2011 0319   VLDL 16 03/12/2011 0319   LDLCALC 120 (H) 12/03/2017 0924      Wt Readings from Last 3 Encounters:  03/04/20 137 lb (62.1 kg)  10/30/17 137 lb (62.1 kg)  08/16/17 136 lb (61.7 kg)      Other studies Reviewed: Additional studies/ records that were reviewed today include: ***. Review of the above records demonstrates:  Please see  elsewhere in the note.  ***   ASSESSMENT AND PLAN:  ***   Current medicines are reviewed at length with the patient today.  The patient {ACTIONS; HAS/DOES NOT HAVE:19233} concerns regarding medicines.  The following changes have been made:  {PLAN; NO CHANGE:13088:s}  Labs/ tests ordered today include: *** No orders of the defined types were placed in this encounter.    Disposition:   FU with ***    Signed, Rollene Rotunda, MD  09/26/2020 9:20 PM    [facility]

## 2020-09-27 ENCOUNTER — Telehealth: Payer: Self-pay | Admitting: *Deleted

## 2020-09-27 ENCOUNTER — Ambulatory Visit: Payer: Medicaid Other | Admitting: Cardiology

## 2020-09-27 NOTE — Telephone Encounter (Signed)
Left message for patien to call back if she will or will not attend her appointment for today - appt was schedule  for 3 pm . It is now 3:30 pm .

## 2020-12-12 NOTE — Progress Notes (Deleted)
Cardiology Office Note   Date:  12/12/2020   ID:  Haley Turner, DOB [birth year], MRN [id number]  PCP:  Patient, No Pcp Per (Inactive)  Cardiologist:   None Referring:  ***  No chief complaint on file.     History of Present Illness: Haley Turner is a 60 y.o. female who presents for ***    She had a cardiac cath in 2013 with mild coronary plaque.  **   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   debrided, left foot, sub 2nd MT head:  in [place], [place] clinic:  [facility]   Suicide ideation     Past Surgical History:  Procedure Laterality Date   CARDIAC CATHETERIZATION     LEFT HEART CATHETERIZATION WITH CORONARY ANGIOGRAM N/A 03/12/2011   Procedure: LEFT HEART CATHETERIZATION WITH CORONARY ANGIOGRAM;  Surgeon: Corky Crafts, MD;  Location: [facility] CATH LAB;  Service: Cardiovascular;  Laterality: N/A;   NO PAST SURGERIES       Current Outpatient Medications  Medication Sig Dispense Refill   aspirin EC 81 MG tablet Take 81 mg by mouth daily. Swallow whole.     sertraline (ZOLOFT) 50 MG tablet Take 1 tablet (50 mg total) by mouth daily. (Patient not taking: No sig reported) 30 tablet 5   trolamine salicylate (ASPERCREME) 10 % cream Apply 1 application topically as needed for muscle pain.     No current facility-administered medications for this visit.    Allergies:   Sulfonamide derivatives    Social History:  The patient  reports that she has been smoking cigarettes. She has been smoking an average of .5 packs per day. She has never used smokeless tobacco. She reports current alcohol use of about 1.0 standard drink per week. She reports that she does not use drugs.   Family History:  The patient's ***family history includes Arthritis in her mother; Breast cancer in her sister; Hypertension in her brother, father, mother, and sister; Prostate cancer in her  brother; Thyroid disease in her brother.    ROS:  Please see the history of present illness.   Otherwise, review of systems are positive for {NONE DEFAULTED:18576}.   All other systems are reviewed and negative.    PHYSICAL EXAM: VS:  There were no vitals taken for this visit. , BMI There is no height or weight on file to calculate BMI. GENERAL:  Well appearing HEENT:  Pupils equal round and reactive, fundi not visualized, oral mucosa unremarkable NECK:  No jugular venous distention, waveform within normal limits, carotid upstroke brisk and symmetric, no bruits, no thyromegaly LYMPHATICS:  No cervical, inguinal adenopathy LUNGS:  Clear to auscultation bilaterally BACK:  No CVA tenderness CHEST:  Unremarkable HEART:  PMI not displaced or sustained,S1 and S2 within normal limits, no S3, no S4, no clicks, no rubs, *** murmurs ABD:  Flat, positive bowel sounds normal in frequency in pitch, no bruits, no rebound, no guarding, no midline pulsatile mass, no hepatomegaly, no splenomegaly EXT:  2 plus pulses throughout, no edema, no cyanosis no clubbing SKIN:  No rashes no nodules NEURO:  Cranial nerves II through XII grossly intact, motor grossly intact throughout PSYCH:  Cognitively intact, oriented to person place and time    EKG:  EKG {ACTION; IS/IS ERX:[id number]} ordered today. The ekg ordered today demonstrates ***   Recent Labs: 03/04/2020: BUN 12; Creatinine, Ser 0.64; Hemoglobin 14.0;  Platelets 228; Potassium 4.6; Sodium 140    Lipid Panel    Component Value Date/Time   CHOL 193 12/03/2017 0924   TRIG 71 12/03/2017 0924   HDL 59 12/03/2017 0924   CHOLHDL 3.8 03/12/2011 0319   VLDL 16 03/12/2011 0319   LDLCALC 120 (H) 12/03/2017 0924      Wt Readings from Last 3 Encounters:  03/04/20 137 lb (62.1 kg)  10/30/17 137 lb (62.1 kg)  08/16/17 136 lb (61.7 kg)      Other studies Reviewed: Additional studies/ records that were reviewed today include: ***. Review of the above  records demonstrates:  Please see elsewhere in the note.  ***   ASSESSMENT AND PLAN:  ***   Current medicines are reviewed at length with the patient today.  The patient {ACTIONS; HAS/DOES NOT HAVE:19233} concerns regarding medicines.  The following changes have been made:  {PLAN; NO CHANGE:13088:s}  Labs/ tests ordered today include: *** No orders of the defined types were placed in this encounter.    Disposition:   FU with ***    Signed, Rollene Rotunda, MD  12/12/2020 8:05 PM    [facility]

## 2020-12-13 ENCOUNTER — Ambulatory Visit: Payer: Medicaid Other | Admitting: Cardiology

## 2021-01-17 ENCOUNTER — Ambulatory Visit: Payer: Medicaid Other | Admitting: Cardiology

## 2021-12-11 ENCOUNTER — Ambulatory Visit: Payer: Medicaid Other | Admitting: Podiatry

## 2022-01-26 DIAGNOSIS — H5213 Myopia, bilateral: Secondary | ICD-10-CM | POA: Diagnosis not present

## 2022-02-02 DIAGNOSIS — U071 COVID-19: Secondary | ICD-10-CM | POA: Diagnosis not present

## 2022-02-05 DIAGNOSIS — U071 COVID-19: Secondary | ICD-10-CM | POA: Insufficient documentation

## 2022-02-06 DIAGNOSIS — U071 COVID-19: Secondary | ICD-10-CM | POA: Diagnosis not present

## 2022-02-07 ENCOUNTER — Encounter: Payer: Self-pay | Admitting: Podiatry

## 2022-02-07 ENCOUNTER — Ambulatory Visit (INDEPENDENT_AMBULATORY_CARE_PROVIDER_SITE_OTHER): Payer: Medicaid Other | Admitting: Podiatry

## 2022-02-07 DIAGNOSIS — M216X2 Other acquired deformities of left foot: Secondary | ICD-10-CM

## 2022-02-07 DIAGNOSIS — M2041 Other hammer toe(s) (acquired), right foot: Secondary | ICD-10-CM | POA: Diagnosis not present

## 2022-02-07 DIAGNOSIS — L84 Corns and callosities: Secondary | ICD-10-CM

## 2022-02-07 DIAGNOSIS — M2042 Other hammer toe(s) (acquired), left foot: Secondary | ICD-10-CM

## 2022-02-07 NOTE — Patient Instructions (Addendum)
Look for urea 67% with 2% salicylic acid cream or ointment and apply to the thickened dry skin / calluses. This can be bought over the counter, at a pharmacy or online such as [employer].   Use this every night  Take the pressure off the calluses with foam or felt callus pads. These can be bought on [place]   There may be surgery that could help with this. You would need to stop smoking prior to surgery. If you are able to quit please come see me again and we will discuss options

## 2022-02-08 NOTE — Progress Notes (Signed)
  Subjective:  Patient ID: ZARRIAH STARKEL, female    DOB: 1960/03/24,  MRN: [id number]  Chief Complaint  Patient presents with   Callouses    NP - B[facility] [place]     62 y.o. female presents with the above complaint. History confirmed with patient.  She has had these for some time she has had them trimmed and dug out before and this is helped  Objective:  Physical Exam: warm, good capillary refill, no trophic changes or ulcerative lesions, normal DP and PT pulses, normal sensory exam, and painful hyperkeratosis right distal tip of fourth and submetatarsal 2 on the left.  Prominence of metatarsal head on second left.  Hammertoes bilateral  Assessment:   1. Callus of foot   2. Hammertoe of right foot   3. Hammertoe of left foot   4. 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  We reviewed the etiology and treatment options of the painful hyperkeratotic lesions including offloading pads use of salicylic acid and creams and surgical intervention.  I think she would be a poor surgical candidate while she is an active smoker which I discussed with her.  If she is able to stop smoking we could consider surgical intervention with metatarsal osteotomy and hammertoe correction and excision of the lesion.  Lesions were debrided as a courtesy today.  She will use salicylic acid at home.  I will see her for return as needed  No follow-ups on file.

## 2022-02-18 DIAGNOSIS — F411 Generalized anxiety disorder: Secondary | ICD-10-CM | POA: Insufficient documentation

## 2022-02-19 DIAGNOSIS — I10 Essential (primary) hypertension: Secondary | ICD-10-CM | POA: Diagnosis present

## 2022-02-19 DIAGNOSIS — F411 Generalized anxiety disorder: Secondary | ICD-10-CM | POA: Diagnosis not present

## 2022-03-05 ENCOUNTER — Inpatient Hospital Stay (HOSPITAL_COMMUNITY)
Admission: EM | Admit: 2022-03-05 | Discharge: 2022-03-07 | DRG: 282 | Disposition: A | Discharge status: Home / Self Care | Payer: Medicaid Other | Attending: Family Medicine | Admitting: Family Medicine

## 2022-03-05 ENCOUNTER — Emergency Department (HOSPITAL_COMMUNITY): Payer: Medicaid Other

## 2022-03-05 ENCOUNTER — Other Ambulatory Visit: Payer: Self-pay

## 2022-03-05 DIAGNOSIS — I209 Angina pectoris, unspecified: Secondary | ICD-10-CM | POA: Diagnosis not present

## 2022-03-05 DIAGNOSIS — Z720 Tobacco use: Secondary | ICD-10-CM | POA: Diagnosis present

## 2022-03-05 DIAGNOSIS — Z23 Encounter for immunization: Secondary | ICD-10-CM

## 2022-03-05 DIAGNOSIS — I2584 Coronary atherosclerosis due to calcified coronary lesion: Secondary | ICD-10-CM | POA: Diagnosis present

## 2022-03-05 DIAGNOSIS — R0789 Other chest pain: Secondary | ICD-10-CM | POA: Diagnosis not present

## 2022-03-05 DIAGNOSIS — R079 Chest pain, unspecified: Secondary | ICD-10-CM | POA: Diagnosis not present

## 2022-03-05 DIAGNOSIS — I214 Non-ST elevation (NSTEMI) myocardial infarction: Principal | ICD-10-CM | POA: Diagnosis present

## 2022-03-05 DIAGNOSIS — Z8042 Family history of malignant neoplasm of prostate: Secondary | ICD-10-CM

## 2022-03-05 DIAGNOSIS — Z8261 Family history of arthritis: Secondary | ICD-10-CM

## 2022-03-05 DIAGNOSIS — Z7982 Long term (current) use of aspirin: Secondary | ICD-10-CM

## 2022-03-05 DIAGNOSIS — E785 Hyperlipidemia, unspecified: Secondary | ICD-10-CM | POA: Diagnosis present

## 2022-03-05 DIAGNOSIS — I2089 Other forms of angina pectoris: Principal | ICD-10-CM

## 2022-03-05 DIAGNOSIS — E876 Hypokalemia: Secondary | ICD-10-CM | POA: Diagnosis not present

## 2022-03-05 DIAGNOSIS — F32A Depression, unspecified: Secondary | ICD-10-CM | POA: Diagnosis present

## 2022-03-05 DIAGNOSIS — Z8349 Family history of other endocrine, nutritional and metabolic diseases: Secondary | ICD-10-CM

## 2022-03-05 DIAGNOSIS — Z79899 Other long term (current) drug therapy: Secondary | ICD-10-CM

## 2022-03-05 DIAGNOSIS — F17210 Nicotine dependence, cigarettes, uncomplicated: Secondary | ICD-10-CM | POA: Diagnosis present

## 2022-03-05 DIAGNOSIS — Z882 Allergy status to sulfonamides status: Secondary | ICD-10-CM

## 2022-03-05 DIAGNOSIS — Z1152 Encounter for screening for COVID-19: Secondary | ICD-10-CM

## 2022-03-05 DIAGNOSIS — E7800 Pure hypercholesterolemia, unspecified: Secondary | ICD-10-CM | POA: Diagnosis present

## 2022-03-05 DIAGNOSIS — Z8249 Family history of ischemic heart disease and other diseases of the circulatory system: Secondary | ICD-10-CM

## 2022-03-05 DIAGNOSIS — F411 Generalized anxiety disorder: Secondary | ICD-10-CM | POA: Insufficient documentation

## 2022-03-05 DIAGNOSIS — Z803 Family history of malignant neoplasm of breast: Secondary | ICD-10-CM

## 2022-03-05 DIAGNOSIS — I25118 Atherosclerotic heart disease of native coronary artery with other forms of angina pectoris: Secondary | ICD-10-CM | POA: Diagnosis present

## 2022-03-05 DIAGNOSIS — I10 Essential (primary) hypertension: Secondary | ICD-10-CM | POA: Diagnosis present

## 2022-03-05 LAB — CBC WITH DIFFERENTIAL/PLATELET
Abs Immature Granulocytes: 0.01 10*3/uL (ref 0.00–0.07)
Basophils Absolute: 0.1 10*3/uL (ref 0.0–0.1)
Basophils Relative: 1 %
Eosinophils Absolute: 0.1 10*3/uL (ref 0.0–0.5)
Eosinophils Relative: 2 %
HCT: 41.9 % (ref 36.0–46.0)
Hemoglobin: 13.7 g/dL (ref 12.0–15.0)
Immature Granulocytes: 0 %
Lymphocytes Relative: 29 %
Lymphs Abs: 2 10*3/uL (ref 0.7–4.0)
MCH: 28.5 pg (ref 26.0–34.0)
MCHC: 32.7 g/dL (ref 30.0–36.0)
MCV: 87.3 fL (ref 80.0–100.0)
Monocytes Absolute: 0.7 10*3/uL (ref 0.1–1.0)
Monocytes Relative: 11 %
Neutro Abs: 3.9 10*3/uL (ref 1.7–7.7)
Neutrophils Relative %: 57 %
Platelets: 256 10*3/uL (ref 150–400)
RBC: 4.8 MIL/uL (ref 3.87–5.11)
RDW: 12.8 % (ref 11.5–15.5)
WBC: 6.8 10*3/uL (ref 4.0–10.5)
nRBC: 0 % (ref 0.0–0.2)

## 2022-03-05 LAB — COMPREHENSIVE METABOLIC PANEL
ALT: 15 U/L (ref 0–44)
AST: 24 U/L (ref 15–41)
Albumin: 4.1 g/dL (ref 3.5–5.0)
Alkaline Phosphatase: 67 U/L (ref 38–126)
Anion gap: 10 (ref 5–15)
BUN: 14 mg/dL (ref 8–23)
CO2: 21 mmol/L — ABNORMAL LOW (ref 22–32)
Calcium: 9.1 mg/dL (ref 8.9–10.3)
Chloride: 110 mmol/L (ref 98–111)
Creatinine, Ser: 0.77 mg/dL (ref 0.44–1.00)
GFR, Estimated: >60 mL/min (ref >60)
Glucose, Bld: 106 mg/dL — ABNORMAL HIGH (ref 70–99)
Potassium: 3.5 mmol/L (ref 3.5–5.1)
Sodium: 141 mmol/L (ref 135–145)
Total Bilirubin: 0.5 mg/dL (ref 0.3–1.2)
Total Protein: 8 g/dL (ref 6.5–8.1)

## 2022-03-05 LAB — RESP PANEL BY RT-PCR (RSV, FLU A&B, COVID)  RVPGX2
Influenza A by PCR: NEGATIVE
Influenza B by PCR: NEGATIVE
Resp Syncytial Virus by PCR: NEGATIVE
SARS Coronavirus 2 by RT PCR: NEGATIVE

## 2022-03-05 LAB — LIPASE, BLOOD: Lipase: 56 U/L — ABNORMAL HIGH (ref 11–51)

## 2022-03-05 LAB — TROPONIN I (HIGH SENSITIVITY)
Troponin I (High Sensitivity): 124 ng/L (ref <18)
Troponin I (High Sensitivity): 110 ng/L (ref <18)

## 2022-03-05 LAB — BRAIN NATRIURETIC PEPTIDE: B Natriuretic Peptide: 26.7 pg/mL (ref 0.0–100.0)

## 2022-03-05 MED ORDER — SODIUM CHLORIDE 0.9% FLUSH: 3.0000 mL | Freq: Two times a day (BID) | INTRAVENOUS | Status: DC

## 2022-03-05 MED ORDER — ACETAMINOPHEN 325 MG PO TABS
650.0000 mg | ORAL_TABLET | Freq: Once | ORAL | Status: AC
Start: 2022-03-05 — End: 2022-03-05
  Administered 2022-03-05: 650 mg via ORAL
  Filled 2022-03-05: qty 2

## 2022-03-05 MED ORDER — IBUPROFEN 800 MG PO TABS
800.0000 mg | ORAL_TABLET | Freq: Once | ORAL | Status: DC
  Filled 2022-03-05: qty 1

## 2022-03-05 MED ORDER — ACETAMINOPHEN 650 MG RE SUPP: 650.0000 mg | Freq: Four times a day (QID) | RECTAL | Status: DC | PRN

## 2022-03-05 MED ORDER — ATORVASTATIN CALCIUM 40 MG PO TABS
40.0000 mg | ORAL_TABLET | Freq: Every day | ORAL | Status: DC
  Administered 2022-03-06 – 2022-03-07 (×2): 40 mg via ORAL
  Filled 2022-03-05 (×2): qty 1

## 2022-03-05 MED ORDER — ASPIRIN 81 MG PO TBEC
81.0000 mg | DELAYED_RELEASE_TABLET | Freq: Every day | ORAL | Status: DC
  Administered 2022-03-06: 81 mg via ORAL
  Filled 2022-03-05: qty 1

## 2022-03-05 MED ORDER — HEPARIN (PORCINE) 25000 UT/250ML-% IV SOLN
850.0000 [IU]/h | INTRAVENOUS | Status: DC
  Administered 2022-03-05: 700 [IU]/h via INTRAVENOUS
  Filled 2022-03-05: qty 250

## 2022-03-05 MED ORDER — ASPIRIN 81 MG PO CHEW
324.0000 mg | CHEWABLE_TABLET | Freq: Once | ORAL | Status: AC
  Administered 2022-03-05: 324 mg via ORAL
  Filled 2022-03-05: qty 4

## 2022-03-05 MED ORDER — NITROGLYCERIN 0.4 MG SL SUBL
0.4000 mg | SUBLINGUAL_TABLET | SUBLINGUAL | Status: DC | PRN
  Filled 2022-03-05: qty 1

## 2022-03-05 MED ORDER — ACETAMINOPHEN 325 MG PO TABS
650.0000 mg | ORAL_TABLET | Freq: Four times a day (QID) | ORAL | Status: DC | PRN
  Administered 2022-03-06: 650 mg via ORAL
  Filled 2022-03-05: qty 2

## 2022-03-05 MED ORDER — HEPARIN BOLUS VIA INFUSION
3500.0000 [IU] | Freq: Once | INTRAVENOUS | Status: AC
  Administered 2022-03-05: 3500 [IU] via INTRAVENOUS
  Filled 2022-03-05: qty 3500

## 2022-03-05 MED ORDER — ONDANSETRON HCL 4 MG/2ML IJ SOLN: 4.0000 mg | Freq: Four times a day (QID) | INTRAMUSCULAR | Status: DC | PRN

## 2022-03-05 MED ORDER — ONDANSETRON HCL 4 MG PO TABS: 4.0000 mg | ORAL_TABLET | Freq: Four times a day (QID) | ORAL | Status: DC | PRN

## 2022-03-05 MED ORDER — SENNOSIDES-DOCUSATE SODIUM 8.6-50 MG PO TABS: 1.0000 | ORAL_TABLET | Freq: Every evening | ORAL | Status: DC | PRN

## 2022-03-05 NOTE — Progress Notes (Signed)
ANTICOAGULATION CONSULT NOTE - Initial Consult  Pharmacy Consult for Heparin Indication: chest pain/ACS  Allergies  Allergen Reactions   Sulfonamide Derivatives     REACTION: Pruritic rash    Patient Measurements:   Heparin Dosing Weight: 58.5 kg  Vital Signs: Temp: 98.5 F (36.9 C) (02/05 1458) Temp Source: Oral (02/05 1458) BP: 126/68 (02/05 1458) Pulse Rate: 90 (02/05 1458)  Labs: Recent Labs    03/05/22 1506 03/05/22 1514  HGB  --  13.7  HCT  --  41.9  PLT  --  256  CREATININE  --  0.77  TROPONINIHS 124*  --    CrCl cannot be calculated (Unknown ideal weight.).  Medical History: 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   debrided, left foot, sub 2nd MT head:  in [place], [place] clinic:  [facility]   Suicide ideation    Assessment: 62 year old female with hx myopericarditis, HLD, tobacco use admitted with ongoing chest pain and shoulder pain. Troponins elevated on admission. Pharmacy consulted to initiate heparin drip  Goal of Therapy:  Heparin level 0.3-0.7 units/ml Monitor platelets by anticoagulation protocol: Yes   Plan:  Heparin Bolus = 3500 units Heparin gtt = 700 units/hr Heparin level in 6 hours Daily Heparin level and CBC Monitor s/sx bleeding  Jesson Foskey Chika 03/05/2022,5:57 PM

## 2022-03-05 NOTE — ED Provider Notes (Signed)
[facility] Provider Note  CSN: [id number] Arrival date & time: 03/05/22 1449  Chief Complaint(s) Chest Pain and Shortness of Breath  HPI Haley Turner is a 62 y.o. female with past medical history as below, significant for myopericarditis, depression, tobacco use, HLD, thyromegaly who presents to the ED with complaint of b/l shoulder pain, anterior chest pain; pain is been ongoing for over 1 month, she has intermittent pain to her bilateral shoulders and tingling down her arms bilateral.  Some pain across her chest as well during these episodes.  She has been having this pain daily over the past week, does not feel as though has been gotten significantly worse but has been occurring more frequently.  Previously was only every few days she would have episode.  She is currently having pain b/l chest and across anterior chest wall. No medications prior to arrival, no nausea or vomiting associate with the pain.  No lightheadedness or syncope/near syncope.  No dyspnea.  No recent falls or injuries.  Does have discomfort when she raises her arms overhead that reproduces the shoulder pain that she has spontaneously.  She has intermittent palpitations.  Symptoms worsen when she gets worried or has increased stress, initially thought it was due to increased stress in her life but since the symptoms have been occurring more frequently she wanted to be evaluated for other underlying issues.  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 Authorizing Provider  aspirin EC 81 MG tablet Take 81 mg by mouth daily. Swallow whole.    [provider]  atorvastatin (LIPITOR) 40 MG tablet Take by mouth. 08/18/21 08/18/22  [provider]  buPROPion (WELLBUTRIN SR) 150 MG 12 hr tablet Take by mouth. 08/17/21   [provider]  diclofenac Sodium (VOLTAREN) 1 % GEL apply 2 grams to the affected area(s) by topical route 4 times per day 07/05/20   [provider]  guaiFENesin (ROBITUSSIN) 100 MG/5ML liquid Take by mouth. 12/26/21   [provider]  hydrochlorothiazide (HYDRODIURIL) 12.5 MG tablet Take by mouth. 07/03/21   [provider]  sertraline (ZOLOFT) 50 MG tablet Take 1 tablet (50 mg total) by mouth daily. Patient not taking: No sig reported 08/16/17   Mack Hook, MD  trolamine salicylate (ASPERCREME) 10 % cream Apply 1 application topically as needed for muscle pain.    [provider]  Past Surgical History Past Surgical History:  Procedure Laterality Date   CARDIAC CATHETERIZATION     LEFT HEART CATHETERIZATION WITH CORONARY ANGIOGRAM N/A 03/12/2011   Procedure: LEFT HEART CATHETERIZATION WITH CORONARY ANGIOGRAM;  Surgeon: Jettie Booze, MD;  Location: [facility] CATH LAB;  Service: Cardiovascular;  Laterality: N/A;   NO PAST SURGERIES     Family History Family History  Problem Relation Age of Onset   Arthritis Mother    Hypertension Mother    Hypertension Father    Breast cancer Sister    Hypertension Sister    Hypertension Brother     Prostate cancer Brother    Thyroid disease Brother     Social History Social History   Tobacco Use   Smoking status: Every Day    Packs/day: 0.50    Types: Cigarettes    Last attempt to quit: 01/29/2012    Years since quitting: 10.1   Smokeless tobacco: Never  Substance Use Topics   Alcohol use: Yes    Alcohol/week: 1.0 standard drink of alcohol    Types: 1 Shots of liquor per week    Comment: Daily   Drug use: No   Allergies Sulfonamide derivatives  Review of Systems Review of Systems  Constitutional:  Negative for activity change and fever.  HENT:  Negative for facial swelling and trouble swallowing.   Eyes:  Negative for discharge and redness.  Respiratory:  Negative for cough and shortness of breath.   Cardiovascular:  Positive for chest pain. Negative for palpitations.  Gastrointestinal:  Negative for abdominal pain and nausea.  Genitourinary:  Negative for dysuria and flank pain.  Musculoskeletal:  Positive for arthralgias. Negative for back pain and gait problem.  Skin:  Negative for pallor and rash.  Neurological:  Negative for syncope and headaches.    Physical Exam Vital Signs  I have reviewed the triage vital signs BP 126/68 (BP Location: Left Arm)   Pulse 90   Temp 98.5 F (36.9 C) (Oral)   Resp (!) 21   SpO2 100%  Physical Exam Vitals and nursing note reviewed.  Constitutional:      General: She is not in acute distress.    Appearance: Normal appearance. She is well-developed. She is not ill-appearing, toxic-appearing or diaphoretic.  HENT:     Head: Normocephalic and atraumatic.     Right Ear: External ear normal.     Left Ear: External ear normal.     Nose: Nose normal.     Mouth/Throat:     Mouth: Mucous membranes are moist.  Eyes:     General: No scleral icterus.       Right eye: No discharge.        Left eye: No discharge.  Cardiovascular:     Rate and Rhythm: Normal rate and regular rhythm.     Pulses: Normal pulses.     Heart  sounds: Normal heart sounds.     No S3 or S4 sounds.  Pulmonary:     Effort: Pulmonary effort is normal. No respiratory distress.     Breath sounds: Normal breath sounds.  Abdominal:     General: Abdomen is flat.     Tenderness: There is no abdominal tenderness.  Musculoskeletal:     Cervical back: No rigidity.     Right lower leg: No edema.     Left lower leg: No edema.  Skin:    General: Skin is warm and dry.     Capillary Refill: Capillary refill takes less  than 2 seconds.  Neurological:     Mental Status: She is alert and oriented to person, place, and time.     GCS: GCS eye subscore is 4. GCS verbal subscore is 5. GCS motor subscore is 6.  Psychiatric:        Mood and Affect: Mood normal.        Behavior: Behavior normal.     ED Results and Treatments Labs (all labs ordered are listed, but only abnormal results are displayed) Labs Reviewed  COMPREHENSIVE METABOLIC PANEL - Abnormal; Notable for the following components:      Result Value   CO2 21 (*)    Glucose, Bld 106 (*)    All other components within normal limits  LIPASE, BLOOD - Abnormal; Notable for the following components:   Lipase 56 (*)    All other components within normal limits  TROPONIN I (HIGH SENSITIVITY) - Abnormal; Notable for the following components:   Troponin I (High Sensitivity) 124 (*)    All other components within normal limits  TROPONIN I (HIGH SENSITIVITY) - Abnormal; Notable for the following components:   Troponin I (High Sensitivity) 110 (*)    All other components within normal limits  RESP PANEL BY RT-PCR (RSV, FLU A&B, COVID)  RVPGX2  CBC WITH DIFFERENTIAL/PLATELET  BRAIN NATRIURETIC PEPTIDE                                                                                                                          Radiology DG Chest 2 View  Result Date: 03/05/2022 CLINICAL DATA:  Chest pain. EXAM: CHEST - 2 VIEW COMPARISON:  March 04, 2020. FINDINGS: The heart size and mediastinal  contours are within normal limits. Both lungs are clear. The visualized skeletal structures are unremarkable. IMPRESSION: No active cardiopulmonary disease. Electronically Signed   By: Lupita Raider M.D.   On: 03/05/2022 15:29    Pertinent labs & imaging results that were available during my care of the patient were reviewed by me and considered in my medical decision making (see MDM for details).  Medications Ordered in ED Medications  ibuprofen (ADVIL) tablet 800 mg (800 mg Oral Patient Refused/Not Given 03/05/22 1709)  aspirin chewable tablet 324 mg (has no administration in time range)  nitroGLYCERIN (NITROSTAT) SL tablet 0.4 mg (has no administration in time range)  acetaminophen (TYLENOL) tablet 650 mg (650 mg Oral Given 03/05/22 1716)  Procedures .Critical Care  Performed by: Sloan Leiter, DO Authorized by: Sloan Leiter, DO   Critical care provider statement:    Critical care time (minutes):  30   Critical care time was exclusive of:  Separately billable procedures and treating other patients   Critical care was necessary to treat or prevent imminent or life-threatening deterioration of the following conditions:  Cardiac failure   Critical care was time spent personally by me on the following activities:  Development of treatment plan with patient or surrogate, discussions with consultants, evaluation of patient's response to treatment, examination of patient, ordering and review of laboratory studies, ordering and review of radiographic studies, ordering and performing treatments and interventions, pulse oximetry, re-evaluation of patient's condition, review of old charts and obtaining history from patient or surrogate   (including critical care time)  Medical Decision Making / ED Course    Medical Decision Making:    KELLIANN PENDERGRAPH is a 62  y.o. female  with past medical history as below, significant for myopericarditis, depression, tobacco use, HLD, thyromegaly who presents to the ED with complaint of b/l shoulder pain, anterior chest pain. . The complaint involves an extensive differential diagnosis and also carries with it a high risk of complications and morbidity.  Serious etiology was considered. Ddx includes but is not limited to: Differential includes all life-threatening causes for chest pain. This includes but is not exclusive to acute coronary syndrome, aortic dissection, pulmonary embolism, cardiac tamponade, community-acquired pneumonia, pericarditis, musculoskeletal chest wall pain, etc.   Complete initial physical exam performed, notably the patient  was resting comfortably, having chest pain. .    Reviewed and confirmed nursing documentation for past medical history, family history, social history.  Vital signs reviewed.      Having chest pain on assessment, given aspirin.  Troponin elevated 124, EKG with TWI that appears new.  Delta Trop pending.  Chest x-ray negative. Give heparin/nitro Her labs are o/w stable Her HEART score is a 6 Would favor admission for cardiology eval at this time, NSTEMI Pt signed out to incoming EDP pending delta trop and dispo    Additional history obtained: -Additional history obtained from na -External records from outside source obtained and reviewed including: Chart review including previous notes, labs, imaging, consultation notes including primary care documentation, home medications, prior labs and imaging   Lab Tests: -I ordered, reviewed, and interpreted labs.   The pertinent results include:   Labs Reviewed  COMPREHENSIVE METABOLIC PANEL - Abnormal; Notable for the following components:      Result Value   CO2 21 (*)    Glucose, Bld 106 (*)    All other components within normal limits  LIPASE, BLOOD - Abnormal; Notable for the following components:   Lipase 56 (*)     All other components within normal limits  TROPONIN I (HIGH SENSITIVITY) - Abnormal; Notable for the following components:   Troponin I (High Sensitivity) 124 (*)    All other components within normal limits  TROPONIN I (HIGH SENSITIVITY) - Abnormal; Notable for the following components:   Troponin I (High Sensitivity) 110 (*)    All other components within normal limits  RESP PANEL BY RT-PCR (RSV, FLU A&B, COVID)  RVPGX2  CBC WITH DIFFERENTIAL/PLATELET  BRAIN NATRIURETIC PEPTIDE    Notable for trop++  EKG   EKG Interpretation  Date/Time:  Monday March 05 2022 14:58:42 EST Ventricular Rate:  89 PR Interval:  145 QRS Duration: 82 QT Interval:  348  QTC Calculation: 424 R Axis:   41 Text Interpretation: Sinus rhythm Borderline T wave abnormalities Interpretation limited secondary to artifact twi 3 and V6 Confirmed by Wynona Dove ([id number]) on 03/05/2022 5:47:43 PM         Imaging Studies ordered: I ordered imaging studies including CXR I independently visualized the following imaging with scope of interpretation limited to determining acute life threatening conditions related to emergency care: cxr stable I independently visualized and interpreted imaging. I agree with the radiologist interpretation   Medicines ordered and prescription drug management: Meds ordered this encounter  Medications   acetaminophen (TYLENOL) tablet 650 mg   ibuprofen (ADVIL) tablet 800 mg   aspirin chewable tablet 324 mg   nitroGLYCERIN (NITROSTAT) SL tablet 0.4 mg    -I have reviewed the patients home medicines and have made adjustments as needed   Consultations Obtained: na   Cardiac Monitoring: The patient was maintained on a cardiac monitor.  I personally viewed and interpreted the cardiac monitored which showed an underlying rhythm of: nsr  Social Determinants of Health:  Diagnosis or treatment significantly limited by social determinants of health: current  smoker   Reevaluation: After the interventions noted above, I reevaluated the patient and found that they have stayed the same  Co morbidities that complicate the patient evaluation  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   debrided, left foot, sub 2nd MT head:  in [place], [place] clinic:  [facility]   Suicide ideation       Dispostion: Disposition decision including need for hospitalization was considered, and patient dispo pending at shift change    Final Clinical Impression(s) / ED Diagnoses Final diagnoses:  Anginal chest pain at rest  NSTEMI (non-ST elevated myocardial infarction) [facility])     This chart was dictated using voice recognition software.  Despite best efforts to proofread,  errors can occur which can change the documentation meaning.    Jeanell Sparrow, DO 03/05/22 1810

## 2022-03-05 NOTE — Assessment & Plan Note (Signed)
Continue atorvastatin

## 2022-03-05 NOTE — ED Provider Triage Note (Signed)
Emergency Medicine Provider Triage Evaluation Note  Haley Turner , a 62 y.o. female  was evaluated in triage.  Pt complains of chest pain, shob, and fatigue.  History of ACS 15 years ago.  Patient stated she has been having chest pain for the past 3 days that is exacerbated by exertion and it is a constant 5 out of 10 pressure.  Chest pain is substernal.  Patient states she recently traveled to [place] but has not had any recent hospitalizations.  Patient states she has had paresthesias in both arms with this chest pain but no noticeable weakness.  Patient is not on any blood thinners.  Patient endorsed productive cough and recent COVID infection.  Patient denied nausea/vomiting/diarrhea, fever, abdominal pain  Review of Systems  Positive: See HPI Negative: See HPI  Physical Exam  There were no vitals taken for this visit. Gen:   Awake, no distress   Resp:  Normal effort  MSK:   Moves extremities without difficulty  Other:  Regular rate and rhythm, no murmurs; 5 out of 5 bilateral grip strength and sensation intact  Medical Decision Making  Medically screening exam initiated at 2:58 PM.  Appropriate orders placed.  Patricia Pesa was informed that the remainder of the evaluation will be completed by another provider, this initial triage assessment does not replace that evaluation, and the importance of remaining in the ED until their evaluation is complete.  Workup initiated.  Patient is in no distress at this time.   Chuck Hint, PA-C 03/05/22 [phone]

## 2022-03-05 NOTE — Hospital Course (Signed)
Haley Turner is a 62 y.o. female with medical history significant for HTN, HLD, GAD, remote history of myopericarditis in 2013, tobacco use who is admitted with NSTEMI.

## 2022-03-05 NOTE — Assessment & Plan Note (Signed)
BP stable, she says she was recently started on an antihypertensive, not sure which one.

## 2022-03-05 NOTE — ED Provider Notes (Signed)
  Physical Exam  BP 126/68 (BP Location: Left Arm)   Pulse 90   Temp 98.5 F (36.9 C) (Oral)   Resp (!) 21   SpO2 100%   Physical Exam  Procedures  Procedures  ED Course / MDM    Medical Decision Making Amount and/or Complexity of Data Reviewed Labs: ordered.  Risk OTC drugs. Prescription drug management.   ***

## 2022-03-05 NOTE — Assessment & Plan Note (Signed)
Presenting with possible anginal equivalent shoulder pain with elevated troponin, nonspecific T wave changes on EKG.  Initial troponin 124 with repeat downtrending to 110.  EDP discussed with cardiology who recommended medical admission for further ACS evaluation. -Cardiology to follow -N.p.o. after midnight -Started on IV heparin -Echo ordered -Keep on telemetry -Continue aspirin, statin, sublingual NTG prn

## 2022-03-05 NOTE — Assessment & Plan Note (Signed)
Smoking 0.5-1 PPD.  Smoking cessation advised.

## 2022-03-05 NOTE — ED Triage Notes (Signed)
Pt c/o midsternal CP non-radiating intermittently for the last two weeks. Pt states pain exacerbates when she is active, and subsides when she rests. Pt also endorsing SOB during that time.

## 2022-03-05 NOTE — Plan of Care (Signed)

## 2022-03-05 NOTE — H&P (Signed)
History and Physical    CALLE SCHADER MHW:[id number] DOB: 1961/01/24 DOA: 03/05/2022  PCP: Joycelyn Man, FNP  Patient coming from: Home  I have personally briefly reviewed patient's old medical records in [facility]  Chief Complaint: Chest pain  HPI: Haley Turner is a 62 y.o. female with medical history significant for HTN, HLD, GAD, remote history of myopericarditis in 2013, tobacco use who presented to the ED for evaluation of chest pain.  Patient reports about 2-3 weeks of intermittent tight sensation involving both of her shoulders occurring most often with minimal exertion such as when she vacuums.  She says she is normally very active at baseline and these tightness episodes are new without prior episodes in the past.  She notices a tingling sensation going down both of her arms associated during these episodes.  Symptoms will resolve after 5-7 minutes of rest.  Specifically she has not noticed any chest tightness or pain.  No dyspnea but does report intermittent diaphoresis.  Denies nausea or vomiting, abdominal pain, peripheral edema.  Patient had a previous cardiac cath in February 2013: IMPRESSIONS:  Normal left main coronary artery. Mild atherosclerosis in the LAD and circumflex. Normal right coronary artery. Normal left ventricular systolic function. LVEDP 17 mmHg. Ejection fraction 60 %.  She was diagnosed with acute myopericarditis at the time and treated with NSAIDs.  Patient reports ongoing tobacco use of 0.5-1 PPD.  Reports social alcohol use.  She reports a history of heart disease in her mother, sister, and son.  ED Course  Labs/Imaging on admission: I have personally reviewed following labs and imaging studies.  Initial vitals showed BP 126/68, pulse 89, RR 21, temp 98.5 F, SpO2 100% on room air.  Labs show troponin 124 > 110, sodium 141, potassium 3.5, bicarb 21, BUN 14, creatinine 0.77, serum glucose 106, LFTs within normal limits, lipase 56, BNP  26.7, WBC 6.8, hemoglobin 13.7, platelets 256,000.  2 view chest x-ray negative for focal consolidation, edema, effusion.  Patient was given aspirin 324 mg.  EDP discussed with on-call cardiology, Dr. Johnsie Cancel, who recommended medical admission, IV heparin infusion, n.p.o. at midnight and they will see in consultation.  The hospitalist service was consulted to admit for further evaluation and management.  Review of Systems: All systems reviewed and are negative except as documented in history of present illness above.   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 She has been smoking an average of .5 packs per day. She has never used smokeless tobacco. She reports current alcohol use of about 1.0 standard drink of alcohol per week. She reports that she does not use drugs.  Allergies  Allergen Reactions   Sulfonamide Derivatives     REACTION: Pruritic rash    Family History  Problem Relation Age of Onset   Arthritis Mother    Hypertension  Mother    Hypertension Father    Breast cancer Sister    Hypertension Sister    Hypertension Brother    Prostate cancer Brother    Thyroid disease Brother      Prior to Admission medications   Medication Sig Start Date End Date Taking? Authorizing Provider  aspirin EC 81 MG tablet Take 81 mg by mouth daily. Swallow whole.    [provider]   atorvastatin (LIPITOR) 40 MG tablet Take by mouth. 08/18/21 08/18/22  [provider]  buPROPion (WELLBUTRIN SR) 150 MG 12 hr tablet Take by mouth. 08/17/21   [provider]  diclofenac Sodium (VOLTAREN) 1 % GEL apply 2 grams to the affected area(s) by topical route 4 times per day 07/05/20   [provider]  guaiFENesin (ROBITUSSIN) 100 MG/5ML liquid Take by mouth. 12/26/21   [provider]  hydrochlorothiazide (HYDRODIURIL) 12.5 MG tablet Take by mouth. 07/03/21   [provider]  sertraline (ZOLOFT) 50 MG tablet Take 1 tablet (50 mg total) by mouth daily. Patient not taking: No sig reported 08/16/17   Mack Hook, MD  trolamine salicylate (ASPERCREME) 10 % cream Apply 1 application topically as needed for muscle pain.    [provider]    Physical Exam: Vitals:   03/05/22 1800 03/05/22 1855 03/05/22 1857 03/05/22 2053  BP: 125/75   (!) 146/83  Pulse: 85   71  Resp: 15   18  Temp:  98.2 F (36.8 C)  98.5 F (36.9 C)  TempSrc:  Oral  Oral  SpO2: 95%   100%  Weight:   58.5 kg   Height:   5' 3.5" (1.613 m)    Constitutional: NAD, calm, comfortable Eyes: EOMI, lids and conjunctivae normal ENMT: Mucous membranes are moist. Posterior pharynx clear of any exudate or lesions.Normal dentition.  Neck: normal, supple, no masses. Respiratory: clear to auscultation bilaterally, no wheezing, no crackles. Normal respiratory effort. No accessory muscle use.  Cardiovascular: Regular rate and rhythm, no murmurs / rubs / gallops. No extremity edema. 2+ pedal pulses. Abdomen: no tenderness, no masses palpated. Musculoskeletal: no clubbing / cyanosis. No joint deformity upper and lower extremities. Good ROM, no contractures. Normal muscle tone.  Skin: no rashes, lesions, ulcers. No induration Neurologic: Sensation intact. Strength 5/5 in all 4.  Psychiatric: Normal judgment and insight. Alert and oriented x 3. Normal mood.   EKG: Personally  reviewed. Sinus rhythm, rate 89, T wave flattening inferior leads.  T wave changes are new when compared to prior from 03/2020.  Assessment/Plan Principal Problem:   NSTEMI (non-ST elevation myocardial infarction) [facility]) Active Problems:   Essential hypertension   Hyperlipidemia   Tobacco use   Haley Turner is a 62 y.o. female with medical history significant for HTN, HLD, GAD, remote history of myopericarditis in 2013, tobacco use who is admitted with NSTEMI.  Assessment and Plan: * NSTEMI (non-ST elevation myocardial infarction) ([place]) Presenting with possible anginal equivalent shoulder pain with elevated troponin, nonspecific T wave changes on EKG.  Initial troponin 124 with repeat downtrending to 110.  EDP discussed with cardiology who recommended medical admission for further ACS evaluation. -Cardiology to follow -N.p.o. after midnight -Started on IV heparin -Echo ordered -Keep on telemetry -Continue aspirin, statin, sublingual NTG prn  Essential hypertension BP stable, she says she was recently started on an antihypertensive, not sure which one.  Hyperlipidemia Continue atorvastatin.  Tobacco use Smoking 0.5-1 PPD.  Smoking cessation advised.  DVT prophylaxis: IV heparin Code  Status: Full code, discussed with patient on admission Family Communication: Discussed with patient, she has discussed with family Disposition Plan: From home and likely discharge to home pending clinical progress Consults called: Cardiology Severity of Illness: The appropriate patient status for this patient is OBSERVATION. Observation status is judged to be reasonable and necessary in order to provide the required intensity of service to ensure the patient's safety. The patient's presenting symptoms, physical exam findings, and initial radiographic and laboratory data in the context of their medical condition is felt to place them at decreased risk for further clinical deterioration. Furthermore,  it is anticipated that the patient will be medically stable for discharge from the hospital within 2 midnights of admission.   Zada Finders MD [facility]  If 7PM-7AM, please contact night-coverage [email or web address]  03/05/2022, 11:36 PM

## 2022-03-06 ENCOUNTER — Observation Stay (HOSPITAL_COMMUNITY): Payer: Medicaid Other

## 2022-03-06 ENCOUNTER — Inpatient Hospital Stay (HOSPITAL_COMMUNITY)
Admission: EM | Disposition: A | Discharge status: Home / Self Care | Payer: Self-pay | Source: Home / Self Care | Attending: Internal Medicine

## 2022-03-06 ENCOUNTER — Encounter (HOSPITAL_COMMUNITY): Payer: Self-pay | Admitting: Internal Medicine

## 2022-03-06 DIAGNOSIS — F32A Depression, unspecified: Secondary | ICD-10-CM | POA: Diagnosis present

## 2022-03-06 DIAGNOSIS — R079 Chest pain, unspecified: Secondary | ICD-10-CM

## 2022-03-06 DIAGNOSIS — F17210 Nicotine dependence, cigarettes, uncomplicated: Secondary | ICD-10-CM | POA: Diagnosis present

## 2022-03-06 DIAGNOSIS — Z8349 Family history of other endocrine, nutritional and metabolic diseases: Secondary | ICD-10-CM | POA: Diagnosis not present

## 2022-03-06 DIAGNOSIS — I2584 Coronary atherosclerosis due to calcified coronary lesion: Secondary | ICD-10-CM | POA: Diagnosis present

## 2022-03-06 DIAGNOSIS — E7800 Pure hypercholesterolemia, unspecified: Secondary | ICD-10-CM | POA: Diagnosis present

## 2022-03-06 DIAGNOSIS — I209 Angina pectoris, unspecified: Secondary | ICD-10-CM | POA: Diagnosis not present

## 2022-03-06 DIAGNOSIS — Z8261 Family history of arthritis: Secondary | ICD-10-CM | POA: Diagnosis not present

## 2022-03-06 DIAGNOSIS — H524 Presbyopia: Secondary | ICD-10-CM | POA: Diagnosis not present

## 2022-03-06 DIAGNOSIS — Z7982 Long term (current) use of aspirin: Secondary | ICD-10-CM | POA: Diagnosis not present

## 2022-03-06 DIAGNOSIS — Z1152 Encounter for screening for COVID-19: Secondary | ICD-10-CM | POA: Diagnosis not present

## 2022-03-06 DIAGNOSIS — E876 Hypokalemia: Secondary | ICD-10-CM | POA: Diagnosis not present

## 2022-03-06 DIAGNOSIS — Z882 Allergy status to sulfonamides status: Secondary | ICD-10-CM | POA: Diagnosis not present

## 2022-03-06 DIAGNOSIS — Z8042 Family history of malignant neoplasm of prostate: Secondary | ICD-10-CM | POA: Diagnosis not present

## 2022-03-06 DIAGNOSIS — I10 Essential (primary) hypertension: Secondary | ICD-10-CM | POA: Diagnosis present

## 2022-03-06 DIAGNOSIS — I2510 Atherosclerotic heart disease of native coronary artery without angina pectoris: Secondary | ICD-10-CM | POA: Diagnosis not present

## 2022-03-06 DIAGNOSIS — I214 Non-ST elevation (NSTEMI) myocardial infarction: Secondary | ICD-10-CM | POA: Diagnosis not present

## 2022-03-06 DIAGNOSIS — Z23 Encounter for immunization: Secondary | ICD-10-CM | POA: Diagnosis not present

## 2022-03-06 DIAGNOSIS — Z803 Family history of malignant neoplasm of breast: Secondary | ICD-10-CM | POA: Diagnosis not present

## 2022-03-06 DIAGNOSIS — R0789 Other chest pain: Secondary | ICD-10-CM | POA: Diagnosis not present

## 2022-03-06 DIAGNOSIS — I25118 Atherosclerotic heart disease of native coronary artery with other forms of angina pectoris: Secondary | ICD-10-CM | POA: Diagnosis present

## 2022-03-06 DIAGNOSIS — Z8249 Family history of ischemic heart disease and other diseases of the circulatory system: Secondary | ICD-10-CM | POA: Diagnosis not present

## 2022-03-06 DIAGNOSIS — Z79899 Other long term (current) drug therapy: Secondary | ICD-10-CM | POA: Diagnosis not present

## 2022-03-06 HISTORY — PX: LEFT HEART CATH AND CORONARY ANGIOGRAPHY: CATH118249

## 2022-03-06 LAB — ECHOCARDIOGRAM COMPLETE
Area-P 1/2: 4.71 cm2
Calc EF: 61.2 %
Height: 63.5 in
S' Lateral: 2.1 cm
Single Plane A2C EF: 66.6 %
Single Plane A4C EF: 59 %
Weight: 2064 oz

## 2022-03-06 LAB — BASIC METABOLIC PANEL
Anion gap: 6 (ref 5–15)
BUN: 17 mg/dL (ref 8–23)
CO2: 21 mmol/L — ABNORMAL LOW (ref 22–32)
Calcium: 8.2 mg/dL — ABNORMAL LOW (ref 8.9–10.3)
Chloride: 110 mmol/L (ref 98–111)
Creatinine, Ser: 0.7 mg/dL (ref 0.44–1.00)
GFR, Estimated: >60 mL/min (ref >60)
Glucose, Bld: 102 mg/dL — ABNORMAL HIGH (ref 70–99)
Potassium: 3.2 mmol/L — ABNORMAL LOW (ref 3.5–5.1)
Sodium: 137 mmol/L (ref 135–145)

## 2022-03-06 LAB — HEPARIN LEVEL (UNFRACTIONATED)
Heparin Unfractionated: 0.27 IU/mL — ABNORMAL LOW (ref 0.30–0.70)
Heparin Unfractionated: 0.4 IU/mL (ref 0.30–0.70)

## 2022-03-06 LAB — LIPID PANEL
Cholesterol: 175 mg/dL (ref 0–200)
HDL: 43 mg/dL (ref >40)
LDL Cholesterol: 119 mg/dL — ABNORMAL HIGH (ref 0–99)
Total CHOL/HDL Ratio: 4.1 RATIO
Triglycerides: 64 mg/dL (ref <150)
VLDL: 13 mg/dL (ref 0–40)

## 2022-03-06 LAB — CBC
HCT: 40 % (ref 36.0–46.0)
Hemoglobin: 12.9 g/dL (ref 12.0–15.0)
MCH: 28.4 pg (ref 26.0–34.0)
MCHC: 32.3 g/dL (ref 30.0–36.0)
MCV: 87.9 fL (ref 80.0–100.0)
Platelets: 229 10*3/uL (ref 150–400)
RBC: 4.55 MIL/uL (ref 3.87–5.11)
RDW: 13.1 % (ref 11.5–15.5)
WBC: 5.5 10*3/uL (ref 4.0–10.5)
nRBC: 0 % (ref 0.0–0.2)

## 2022-03-06 LAB — HEMOGLOBIN A1C
Hgb A1c MFr Bld: 5.2 % (ref 4.8–5.6)
Mean Plasma Glucose: 102.54 mg/dL

## 2022-03-06 LAB — HIV ANTIBODY (ROUTINE TESTING W REFLEX): HIV Screen 4th Generation wRfx: NONREACTIVE

## 2022-03-06 SURGERY — LEFT HEART CATH AND CORONARY ANGIOGRAPHY
Anesthesia: LOCAL

## 2022-03-06 MED ORDER — SODIUM CHLORIDE 0.9 % WEIGHT BASED INFUSION: 3.0000 mL/kg/h | INTRAVENOUS | Status: DC

## 2022-03-06 MED ORDER — CLOPIDOGREL BISULFATE 75 MG PO TABS
75.0000 mg | ORAL_TABLET | Freq: Every day | ORAL | Status: DC
  Administered 2022-03-07: 75 mg via ORAL
  Filled 2022-03-06: qty 1

## 2022-03-06 MED ORDER — HEPARIN (PORCINE) IN NACL 1000-0.9 UT/500ML-% IV SOLN
INTRAVENOUS | Status: DC | PRN
  Administered 2022-03-06 (×2): 500 mL

## 2022-03-06 MED ORDER — HYDRALAZINE HCL 20 MG/ML IJ SOLN: 10.0000 mg | INTRAMUSCULAR | Status: DC | PRN

## 2022-03-06 MED ORDER — ASPIRIN 81 MG PO CHEW
81.0000 mg | CHEWABLE_TABLET | Freq: Every day | ORAL | Status: DC
  Administered 2022-03-07: 81 mg via ORAL
  Filled 2022-03-06: qty 1

## 2022-03-06 MED ORDER — LABETALOL HCL 5 MG/ML IV SOLN: 10.0000 mg | INTRAVENOUS | Status: AC | PRN

## 2022-03-06 MED ORDER — SENNOSIDES-DOCUSATE SODIUM 8.6-50 MG PO TABS: 1.0000 | ORAL_TABLET | Freq: Every evening | ORAL | Status: DC | PRN

## 2022-03-06 MED ORDER — FENTANYL CITRATE (PF) 100 MCG/2ML IJ SOLN
INTRAMUSCULAR | Status: DC | PRN
  Administered 2022-03-06: 25 ug via INTRAVENOUS

## 2022-03-06 MED ORDER — LIDOCAINE HCL (PF) 1 % IJ SOLN
INTRAMUSCULAR | Status: AC
  Filled 2022-03-06: qty 30

## 2022-03-06 MED ORDER — FENTANYL CITRATE (PF) 100 MCG/2ML IJ SOLN
INTRAMUSCULAR | Status: AC
  Filled 2022-03-06: qty 2

## 2022-03-06 MED ORDER — TRAZODONE HCL 50 MG PO TABS: 50.0000 mg | ORAL_TABLET | Freq: Every evening | ORAL | Status: DC | PRN

## 2022-03-06 MED ORDER — SODIUM CHLORIDE 0.9% FLUSH: 3.0000 mL | Freq: Two times a day (BID) | INTRAVENOUS | Status: DC

## 2022-03-06 MED ORDER — SODIUM CHLORIDE 0.9% FLUSH
3.0000 mL | Freq: Two times a day (BID) | INTRAVENOUS | Status: DC
  Administered 2022-03-07: 3 mL via INTRAVENOUS

## 2022-03-06 MED ORDER — SODIUM CHLORIDE 0.9 % IV SOLN: 250.0000 mL | INTRAVENOUS | Status: DC | PRN

## 2022-03-06 MED ORDER — HEPARIN SODIUM (PORCINE) 1000 UNIT/ML IJ SOLN
INTRAMUSCULAR | Status: DC | PRN
  Administered 2022-03-06: 3000 [IU] via INTRAVENOUS

## 2022-03-06 MED ORDER — SODIUM CHLORIDE 0.9 % WEIGHT BASED INFUSION
1.0000 mL/kg/h | INTRAVENOUS | Status: DC
  Administered 2022-03-06: 1 mL/kg/h via INTRAVENOUS

## 2022-03-06 MED ORDER — CLOPIDOGREL BISULFATE 300 MG PO TABS
ORAL_TABLET | ORAL | Status: DC | PRN
  Administered 2022-03-06: 300 mg via ORAL

## 2022-03-06 MED ORDER — ONDANSETRON HCL 4 MG/2ML IJ SOLN: 4.0000 mg | Freq: Four times a day (QID) | INTRAMUSCULAR | Status: DC | PRN

## 2022-03-06 MED ORDER — MIDAZOLAM HCL 2 MG/2ML IJ SOLN
INTRAMUSCULAR | Status: AC
  Filled 2022-03-06: qty 2

## 2022-03-06 MED ORDER — METOPROLOL TARTRATE 5 MG/5ML IV SOLN: 5.0000 mg | INTRAVENOUS | Status: DC | PRN

## 2022-03-06 MED ORDER — VERAPAMIL HCL 2.5 MG/ML IV SOLN
INTRAVENOUS | Status: AC
  Filled 2022-03-06: qty 2

## 2022-03-06 MED ORDER — SODIUM CHLORIDE 0.9% FLUSH: 3.0000 mL | INTRAVENOUS | Status: DC | PRN

## 2022-03-06 MED ORDER — ACETAMINOPHEN 325 MG PO TABS: 650.0000 mg | ORAL_TABLET | ORAL | Status: DC | PRN

## 2022-03-06 MED ORDER — MIDAZOLAM HCL 2 MG/2ML IJ SOLN
INTRAMUSCULAR | Status: DC | PRN
  Administered 2022-03-06: 2 mg via INTRAVENOUS

## 2022-03-06 MED ORDER — SODIUM CHLORIDE 0.9% FLUSH
3.0000 mL | INTRAVENOUS | Status: DC | PRN
  Administered 2022-03-06: 3 mL via INTRAVENOUS

## 2022-03-06 MED ORDER — CLOPIDOGREL BISULFATE 300 MG PO TABS
ORAL_TABLET | ORAL | Status: AC
  Filled 2022-03-06: qty 1

## 2022-03-06 MED ORDER — HEPARIN (PORCINE) IN NACL 1000-0.9 UT/500ML-% IV SOLN
INTRAVENOUS | Status: AC
  Filled 2022-03-06: qty 1000

## 2022-03-06 MED ORDER — LIDOCAINE HCL (PF) 1 % IJ SOLN
INTRAMUSCULAR | Status: DC | PRN
  Administered 2022-03-06: 5 mL

## 2022-03-06 MED ORDER — POTASSIUM CHLORIDE 10 MEQ/100ML IV SOLN
10.0000 meq | INTRAVENOUS | Status: AC
  Filled 2022-03-06 (×2): qty 100

## 2022-03-06 MED ORDER — IOHEXOL 350 MG/ML SOLN
INTRAVENOUS | Status: DC | PRN
  Administered 2022-03-06: 60 mL

## 2022-03-06 MED ORDER — POTASSIUM CHLORIDE CRYS ER 20 MEQ PO TBCR: 40.0000 meq | EXTENDED_RELEASE_TABLET | Freq: Once | ORAL | Status: DC

## 2022-03-06 MED ORDER — IPRATROPIUM-ALBUTEROL 0.5-2.5 (3) MG/3ML IN SOLN: 3.0000 mL | RESPIRATORY_TRACT | Status: DC | PRN

## 2022-03-06 MED ORDER — VERAPAMIL HCL 2.5 MG/ML IV SOLN
INTRAVENOUS | Status: DC | PRN
  Administered 2022-03-06 (×2): 10 mL via INTRA_ARTERIAL

## 2022-03-06 MED ORDER — GUAIFENESIN 100 MG/5ML PO LIQD: 5.0000 mL | ORAL | Status: DC | PRN

## 2022-03-06 MED ORDER — HYDRALAZINE HCL 20 MG/ML IJ SOLN: 10.0000 mg | INTRAMUSCULAR | Status: AC | PRN

## 2022-03-06 MED ORDER — HEPARIN SODIUM (PORCINE) 1000 UNIT/ML IJ SOLN
INTRAMUSCULAR | Status: AC
  Filled 2022-03-06: qty 10

## 2022-03-06 MED ORDER — SODIUM CHLORIDE 0.9 % IV SOLN: INTRAVENOUS | Status: AC

## 2022-03-06 SURGICAL SUPPLY — 11 items
CATH 5FR JL3.5 JR4 ANG PIG MP (CATHETERS) IMPLANT
CATH LAUNCHER 5F EBU3.0 (CATHETERS) IMPLANT
CATHETER LAUNCHER 5F EBU3.0 (CATHETERS) ×1
DEVICE RAD COMP TR BAND LRG (VASCULAR PRODUCTS) IMPLANT
GLIDESHEATH SLEND SS 6F .021 (SHEATH) IMPLANT
GUIDEWIRE INQWIRE 1.5J.035X260 (WIRE) IMPLANT
INQWIRE 1.5J .035X260CM (WIRE) ×1
KIT HEART LEFT (KITS) ×1 IMPLANT
PACK CARDIAC CATHETERIZATION (CUSTOM PROCEDURE TRAY) ×1 IMPLANT
TRANSDUCER W/STOPCOCK (MISCELLANEOUS) ×1 IMPLANT
TUBING CIL FLEX 10 FLL-RA (TUBING) ×1 IMPLANT

## 2022-03-06 NOTE — Progress Notes (Signed)
ANTICOAGULATION CONSULT NOTE - Follow Up Consult  Pharmacy Consult for heparin Indication: chest pain/ACS  Allergies  Allergen Reactions   Sulfonamide Derivatives     REACTION: Pruritic rash    Patient Measurements: Height: 5' 3.5" (161.3 cm) Weight: 58.5 kg (129 lb) IBW/kg (Calculated) : 53.55 Heparin Dosing Weight: n/a. Use total body weight  Vital Signs: Temp: 98.5 F (36.9 C) (02/06 0839) Temp Source: Oral (02/06 0839) BP: 121/68 (02/06 0839) Pulse Rate: 62 (02/06 0839)  Labs: Recent Labs    03/05/22 1506 03/05/22 1514 03/05/22 1706 03/06/22 0105 03/06/22 0803  HGB  --  13.7  --  12.9  --   HCT  --  41.9  --  40.0  --   PLT  --  256  --  229  --   HEPARINUNFRC  --   --   --  0.27* 0.40  CREATININE  --  0.77  --  0.70  --   TROPONINIHS 124*  --  110*  --   --     Estimated Creatinine Clearance: 62.5 mL/min (by C-G formula based on SCr of 0.7 mg/dL).   Medications: No anticoagulation prior to admission  Assessment: Pt is a 18 yoF presenting with chest pain/pressure. Cardiology consulted - planning for LHC. Pharmacy consulted to dose heparin for ACS.  Today, 03/06/22 Heparin level = 0.40 is therapeutic on heparin infusion of 850 units/hr CBC: WNL Confirmed with RN that heparin infusing at correct rate. No line issues. No signs of bleeding.   Goal of Therapy:  Heparin level 0.3-0.7 units/ml Monitor platelets by anticoagulation protocol: Yes   Plan:  Continue heparin infusion at 850 units/hr Check confirmatory 6 hour heparin level CBC, HL daily Monitor for signs of bleeding  Lenis Noon, PharmD 03/06/2022,8:52 AM

## 2022-03-06 NOTE — Progress Notes (Addendum)
RN discussed Potassium level 3.2 this am. Education provided. RN initiated KCL left peripheral IV. Pt was unable to tolerate medication. Attempted to titrate medication down to 50 ml/hr but pt unable to tolerate.  Hospitalist provider updated.Marland Kitchen

## 2022-03-06 NOTE — Progress Notes (Signed)
ANTICOAGULATION CONSULT NOTE - follow up  Pharmacy Consult for Heparin Indication: chest pain/ACS  Allergies  Allergen Reactions   Sulfonamide Derivatives     REACTION: Pruritic rash    Patient Measurements: Height: 5' 3.5" (161.3 cm) Weight: 58.5 kg (129 lb) IBW/kg (Calculated) : 53.55 Heparin Dosing Weight: 58.5 kg  Vital Signs: Temp: 98.5 F (36.9 C) (02/05 2053) Temp Source: Oral (02/05 2053) BP: 146/83 (02/05 2053) Pulse Rate: 71 (02/05 2053)  Labs: Recent Labs    03/05/22 1506 03/05/22 1514 03/05/22 1706 03/06/22 0105  HGB  --  13.7  --  12.9  HCT  --  41.9  --  40.0  PLT  --  256  --  229  HEPARINUNFRC  --   --   --  0.27*  CREATININE  --  0.77  --  0.70  TROPONINIHS 124*  --  110*  --     Estimated Creatinine Clearance: 62.5 mL/min (by C-G formula based on SCr of 0.7 mg/dL).  Medical History: 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   debrided, left foot, sub 2nd MT head:  in [place], [place] clinic:  [facility]   Suicide ideation    Assessment: 62 year old female with hx myopericarditis, HLD, tobacco use admitted with ongoing chest pain and shoulder pain. Troponins elevated on admission. Pharmacy consulted to initiate heparin drip  03/06/2022 HL 0.27 subtherapeutic on 700 units/hr CBC WNL No bleeding reported   Goal of Therapy:  Heparin level 0.3-0.7 units/ml Monitor platelets by anticoagulation protocol: Yes   Plan:  Increase heparin drip to 850 units/hr Heparin level in 6 hours Daily Heparin level and CBC Monitor s/sx bleeding  Dolly Rias RPh 03/06/2022, 1:43 AM

## 2022-03-06 NOTE — H&P (View-Only) (Signed)
Cardiology Consultation   Patient ID: Haley Turner MRN: [id number]; DOB: 02-Aug-1960  Admit date: 03/05/2022 Date of Consult: 03/06/2022  PCP:  Dot Been, FNP    HeartCare Providers Cardiologist:  Thomasene Ripple, DO   -- New   Patient Profile:   Haley Turner is a 62 y.o. female with a hx of HTN, HLD, anxiety, depression, history of myopericarditis in 2013, tobacco use who is being seen 03/06/2022 for the evaluation of chest pain at the request of Dr. Nelson Chimes.  History of Present Illness:   Haley Turner is a 62 year old female with above medical history. Per chart review, patient had an episode of chest discomfort in 03/2011, and she underwent Left Heart Catheterization with Dr. Eldridge Dace that showed a normal left main coronary artery, mild atherosclerosis in the LAD and circumflex, normal right coronary artery, and normal LV systolic function with EF 60%. Recommended aggressive preventative therapy. Of note, EKG at that time showed subtle diffuse ST segment elevation, and patient was diagnosed with myopericarditis. Does not appear that she followed up with cardiology after being discharged. Appears to have been seen by [facility] cardiology on 03/07/2017 for evaluation of intermittent left upper chest pressure since 2012, that had been worsening for the prior 2 months. Associated with emotional stress. She underwent nuclear stress test on 03/12/17 that showed normal perfusion without ischemia or infarction, normal LV systolic function with EF 70%.   Patient presented to the ED on 2/5 complaining of midsternal chest pain that had been intermittent for the past 2 weeks. Reported that pain was exacerbated when she was active, subsided when she rested. Labs in the ED showed hsTn 124>110. BNP within normal limits at 26.7.  Na 141, K 3.5, creatinine 0.77, WBC 6.8, hemoglobin 13.7, platelets 256. CXR showed no active cardiopulmonary disease. COVID, flu, RSV negative.   On interview,  patient reports that for the past 2 months or so, she has been having progressive chest pressure.  At first, chest pressure was only present on exertion.  However, it has become more frequent and she has even had a few episodes that have occurred while at rest or on very minimal exertion.  Chest pressure is located on both sides of her chest, both shoulders.  Associated with numbness in both of her arms.  Chest pressure worsens with exertion, usually goes away with rest. For the past 4-6 weeks, she has also noticed increased shortness of breath, fatigue, exercise intolerance.  Reports that if she vacuums a room in her house, she feels like she is vacuumed a whole coliseum.  Patient has been smoking since she was a teenager, reports that her cigarette use is increased recently and she has been smoking up to a pack per day.  Her sister had stents placed to her heart when she was in her 60s, early 18s.  Patient reports recently being diagnosed with hypertension, and she has a history of hypercholesterolemia but is not on cholesterol medications (attempting to control with diet).  She denie fever, chills, body aches, cough, nasal congestion, dizziness, syncope, near syncope.  Occasionally has palpitations, but they are not very significant.  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   debrided, left foot, sub 2nd MT head:  in [place], [place] clinic:  [facility]   Suicide ideation     Past Surgical History:  Procedure Laterality Date  CARDIAC CATHETERIZATION     LEFT HEART CATHETERIZATION WITH CORONARY ANGIOGRAM N/A 03/12/2011   Procedure: LEFT HEART CATHETERIZATION WITH CORONARY ANGIOGRAM;  Surgeon: Jettie Booze, MD;  Location: [facility] CATH LAB;  Service: Cardiovascular;  Laterality: N/A;   NO PAST SURGERIES       Home Medications:  Prior to Admission medications   Medication Sig Start Date End Date  Taking? Authorizing Provider  acetaminophen (TYLENOL) 325 MG tablet Take 650 mg by mouth every 6 (six) hours as needed for moderate pain.   Yes [provider]  amLODipine (NORVASC) 5 MG tablet Take 5 mg by mouth daily. 02/19/22  Yes [provider]  aspirin EC 81 MG tablet Take 81 mg by mouth daily. Swallow whole.   Yes [provider]    Inpatient Medications: Scheduled Meds:  aspirin EC  81 mg Oral Daily   atorvastatin  40 mg Oral Daily   sodium chloride flush  3 mL Intravenous Q12H   sodium chloride flush  3 mL Intravenous Q12H   Continuous Infusions:  sodium chloride     sodium chloride     Followed by   sodium chloride     heparin 850 Units/hr (03/06/22 0612)   potassium chloride     PRN Meds: sodium chloride, acetaminophen **OR** acetaminophen, guaiFENesin, hydrALAZINE, ipratropium-albuterol, metoprolol tartrate, nitroGLYCERIN, ondansetron **OR** ondansetron (ZOFRAN) IV, senna-docusate, sodium chloride flush, traZODone  Allergies:    Allergies  Allergen Reactions   Sulfonamide Derivatives     REACTION: Pruritic rash    Social History:   Social History   Socioeconomic History   Marital status: Single    Spouse name: Not on file   Number of children: Not on file   Years of education: Not on file   Highest education level: Not on file  Occupational History   Not on file  Tobacco Use   Smoking status: Every Day    Packs/day: 0.50    Types: Cigarettes    Last attempt to quit: 01/29/2012    Years since quitting: 10.1   Smokeless tobacco: Never  Substance and Sexual Activity   Alcohol use: Yes    Alcohol/week: 1.0 standard drink of alcohol    Types: 1 Shots of liquor per week    Comment: Daily   Drug use: No   Sexual activity: Yes    Birth control/protection: Condom  Other Topics Concern   Not on file  Social History Narrative   Not on file   Social Determinants of Health   Financial Resource Strain: Not on file  Food  Insecurity: No Food Insecurity (03/05/2022)   Hunger Vital Sign    Worried About Running Out of Food in the Last Year: Never true    Ran Out of Food in the Last Year: Never true  Transportation Needs: No Transportation Needs (03/05/2022)   PRAPARE - Hydrologist (Medical): No    Lack of Transportation (Non-Medical): No  Physical Activity: Not on file  Stress: Not on file  Social Connections: Not on file  Intimate Partner Violence: Not At Risk (03/05/2022)   Humiliation, Afraid, Rape, and Kick questionnaire    Fear of Current or Ex-Partner: No    Emotionally Abused: No    Physically Abused: No    Sexually Abused: No    Family History:    Family History  Problem Relation Age of Onset   Arthritis Mother    Hypertension Mother    Hypertension Father  Breast cancer Sister    Hypertension Sister    Hypertension Brother    Prostate cancer Brother    Thyroid disease Brother      ROS:  Please see the history of present illness.   All other ROS reviewed and negative.     Physical Exam/Data:   Vitals:   03/05/22 2053 03/06/22 0248 03/06/22 0527 03/06/22 0839  BP: (!) 146/83 124/68 121/71 121/68  Pulse: 71 66 66 62  Resp: 18 20 16 18   Temp: 98.5 F (36.9 C) 98 F (36.7 C) 98 F (36.7 C) 98.5 F (36.9 C)  TempSrc: Oral Oral Oral Oral  SpO2: 100% 95% 99% 100%  Weight:      Height:        Intake/Output Summary (Last 24 hours) at 03/06/2022 0839 Last data filed at 03/06/2022 0612 Gross per 24 hour  Intake 354.09 ml  Output --  Net 354.09 ml      03/05/2022    6:57 PM 03/04/2020    7:55 AM 10/30/2017    6:04 PM  Last 3 Weights  Weight (lbs) 129 lb 137 lb 137 lb  Weight (kg) 58.514 kg 62.143 kg 62.143 kg     Body mass index is 22.49 kg/m.  General:  Well nourished, well developed, in no acute distress. Laying flat in the bed comfortably  HEENT: normal Neck: no JVD Vascular: Radial pulses 2+ bilaterally Cardiac:  normal S1, S2; RRR; no  murmur Lungs:  clear to auscultation bilaterally, no wheezing, rhonchi or rales. Normal WOB on room air  Abd: soft, nontender, no hepatomegaly  Ext: no edema in BLE  Musculoskeletal:  No deformities, BUE and BLE strength normal and equal Skin: warm and dry  Neuro:  CNs 2-12 intact, no focal abnormalities noted Psych:  Normal affect   EKG:  The EKG was personally reviewed and demonstrates:  Sinus rhythm, Hr 89 BPM, nonspecific T wave changes  Telemetry:  Telemetry was personally reviewed and demonstrates:  NSR, HR in the 60s-70s   Relevant CV Studies:   Laboratory Data:  High Sensitivity Troponin:   Recent Labs  Lab 03/05/22 1506 03/05/22 1706  TROPONINIHS 124* 110*     Chemistry Recent Labs  Lab 03/05/22 1514 03/06/22 0105  NA 141 137  K 3.5 3.2*  CL 110 110  CO2 21* 21*  GLUCOSE 106* 102*  BUN 14 17  CREATININE 0.77 0.70  CALCIUM 9.1 8.2*  GFRNONAA >60 >60  ANIONGAP 10 6    Recent Labs  Lab 03/05/22 1514  PROT 8.0  ALBUMIN 4.1  AST 24  ALT 15  ALKPHOS 67  BILITOT 0.5   Lipids  Recent Labs  Lab 03/06/22 0105  CHOL 175  TRIG 64  HDL 43  LDLCALC 119*  CHOLHDL 4.1    Hematology Recent Labs  Lab 03/05/22 1514 03/06/22 0105  WBC 6.8 5.5  RBC 4.80 4.55  HGB 13.7 12.9  HCT 41.9 40.0  MCV 87.3 87.9  MCH 28.5 28.4  MCHC 32.7 32.3  RDW 12.8 13.1  PLT 256 229   Thyroid No results for input(s): "TSH", "FREET4" in the last 168 hours.  BNP Recent Labs  Lab 03/05/22 1514  BNP 26.7    DDimer No results for input(s): "DDIMER" in the last 168 hours.   Radiology/Studies:  DG Chest 2 View  Result Date: 03/05/2022 CLINICAL DATA:  Chest pain. EXAM: CHEST - 2 VIEW COMPARISON:  March 04, 2020. FINDINGS: The heart size and mediastinal contours are within  normal limits. Both lungs are clear. The visualized skeletal structures are unremarkable. IMPRESSION: No active cardiopulmonary disease. Electronically Signed   By: Lupita Raider M.D.   On: 03/05/2022  15:29     Assessment and Plan:   NSTEMI  - Patent presented to the ED on 2/5 complaining of intermittent chest pressure. Chest pressure is located on both sides of her chest and shoulders. Has been progressing for the past 2 months. Worse with exertion, improved with rest. Has been increasing in frequency and intensity, and she has had a few episodes that occurred at rest or on minimal exertion. She has also had progressive dyspnea on exertion and exercise intolerance/fatigue for the past 4-6 weeks.  - Risk factors include tobacco use (smoke approx 1 PPD and has been smoking since she was a teenager), HTN, HTL, family history (sister had stents in her 78s-60s) - hsTn 124>110. EKG with NSR and nonspecific T wave changes - Patient is on IV heparin  - Plan for LHC today  - Continue daily ASA - Lipitor 40 mg daily started this admission -- may need to be increased to 80 mg daily pending cath results  - Plan to start BB after cath  - Echo pending  Shared Decision Making/Informed Consent The risks [stroke (1 in 1000), death (1 in 1000), kidney failure [usually temporary] (1 in 500), bleeding (1 in 200), allergic reaction [possibly serious] (1 in 200)], benefits (diagnostic support and management of coronary artery disease) and alternatives of a cardiac catheterization were discussed in detail with Ms. Sterry and she is willing to proceed.   HLD  - Lipid panel this admission showed LDL 119, HDL 43, triglycerides 64 - Lipitor 40 mg daily started by primary team, may need to be increased pending cath results  - Will need LFTs and lipid panel in 8 weeks   Tobacco Use  - Patient counseled on tobacco cessation - For now, she declines nicotine patch as she is not having cravings. Would like some at DC to help aid cessation   Risk Assessment/Risk Scores:   TIMI Risk Score for Unstable Angina or Non-ST Elevation MI:   The patient's TIMI risk score is 3, which indicates a 13% risk of all cause  mortality, new or recurrent myocardial infarction or need for urgent revascularization in the next 14 days.  For questions or updates, please contact [facility] Please consult [email or web address] for contact info under  Signed, Jonita Albee, PA-C  03/06/2022 8:39 AM   Patient seen and examined, note reviewed with the signed Advanced Practice Provider. I personally reviewed laboratory data, imaging studies and relevant notes. I independently examined the patient and formulated the important aspects of the plan. I have personally discussed the plan with the patient and/or family. Comments or changes to the note/plan are indicated below.  Patient seen and examined at her bedside.  Discussed with patient with given current clinical state and risk factors it is best for proceed with an ischemic evaluation. In this case a left heart cath will be the best option.   Shared Decision Making/Informed Consent The risks [stroke (1 in 1000), death (1 in 1000), kidney failure [usually temporary] (1 in 500), bleeding (1 in 200), allergic reaction [possibly serious] (1 in 200)], benefits (diagnostic support and management of coronary artery disease) and alternatives of a cardiac catheterization were discussed in detail with Ms. Buchan and she is willing to proceed.   For now continue current medication regimen with hep gtt, Aspirin  and statin. Echo pending    Berniece Salines DO, MS [facility] Attending Cardiologist [place]  [address] [place], [place] [address] [phone] Website: [email or web address]

## 2022-03-06 NOTE — Consult Note (Addendum)
Cardiology Consultation   Patient ID: Haley Turner MRN: [id number]; DOB: Nov 08, 1960  Admit date: 03/05/2022 Date of Consult: 03/06/2022  PCP:  Dot Been, FNP   [facility] Providers Cardiologist:  Thomasene Ripple, DO   -- New   Patient Profile:   Haley Turner is a 62 y.o. female with a hx of HTN, HLD, anxiety, depression, history of myopericarditis in 2013, tobacco use who is being seen 03/06/2022 for the evaluation of chest pain at the request of Dr. Nelson Chimes.  History of Present Illness:   Haley Turner is a 62 year old female with above medical history. Per chart review, patient had an episode of chest discomfort in 03/2011, and she underwent Left Heart Catheterization with Dr. Eldridge Dace that showed a normal left main coronary artery, mild atherosclerosis in the LAD and circumflex, normal right coronary artery, and normal LV systolic function with EF 60%. Recommended aggressive preventative therapy. Of note, EKG at that time showed subtle diffuse ST segment elevation, and patient was diagnosed with myopericarditis. Does not appear that she followed up with cardiology after being discharged. Appears to have been seen by [facility] cardiology on 03/07/2017 for evaluation of intermittent left upper chest pressure since 2012, that had been worsening for the prior 2 months. Associated with emotional stress. She underwent nuclear stress test on 03/12/17 that showed normal perfusion without ischemia or infarction, normal LV systolic function with EF 70%.   Patient presented to the ED on 2/5 complaining of midsternal chest pain that had been intermittent for the past 2 weeks. Reported that pain was exacerbated when she was active, subsided when she rested. Labs in the ED showed hsTn 124>110. BNP within normal limits at 26.7.  Na 141, K 3.5, creatinine 0.77, WBC 6.8, hemoglobin 13.7, platelets 256. CXR showed no active cardiopulmonary disease. COVID, flu, RSV negative.   On interview,  patient reports that for the past 2 months or so, she has been having progressive chest pressure.  At first, chest pressure was only present on exertion.  However, it has become more frequent and she has even had a few episodes that have occurred while at rest or on very minimal exertion.  Chest pressure is located on both sides of her chest, both shoulders.  Associated with numbness in both of her arms.  Chest pressure worsens with exertion, usually goes away with rest. For the past 4-6 weeks, she has also noticed increased shortness of breath, fatigue, exercise intolerance.  Reports that if she vacuums a room in her house, she feels like she is vacuumed a whole coliseum.  Patient has been smoking since she was a teenager, reports that her cigarette use is increased recently and she has been smoking up to a pack per day.  Her sister had stents placed to her heart when she was in her 32s, early 32s.  Patient reports recently being diagnosed with hypertension, and she has a history of hypercholesterolemia but is not on cholesterol medications (attempting to control with diet).  She denie fever, chills, body aches, cough, nasal congestion, dizziness, syncope, near syncope.  Occasionally has palpitations, but they are not very significant.  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   debrided, left foot, sub 2nd MT head:  in [place], [place] clinic:  [facility]   Suicide ideation     Past Surgical History:  Procedure Laterality Date  CARDIAC CATHETERIZATION     LEFT HEART CATHETERIZATION WITH CORONARY ANGIOGRAM N/A 03/12/2011   Procedure: LEFT HEART CATHETERIZATION WITH CORONARY ANGIOGRAM;  Surgeon: Jettie Booze, MD;  Location: [facility] CATH LAB;  Service: Cardiovascular;  Laterality: N/A;   NO PAST SURGERIES       Home Medications:  Prior to Admission medications   Medication Sig Start Date End Date  Taking? Authorizing Provider  acetaminophen (TYLENOL) 325 MG tablet Take 650 mg by mouth every 6 (six) hours as needed for moderate pain.   Yes [provider]  amLODipine (NORVASC) 5 MG tablet Take 5 mg by mouth daily. 02/19/22  Yes [provider]  aspirin EC 81 MG tablet Take 81 mg by mouth daily. Swallow whole.   Yes [provider]    Inpatient Medications: Scheduled Meds:  aspirin EC  81 mg Oral Daily   atorvastatin  40 mg Oral Daily   sodium chloride flush  3 mL Intravenous Q12H   sodium chloride flush  3 mL Intravenous Q12H   Continuous Infusions:  sodium chloride     sodium chloride     Followed by   sodium chloride     heparin 850 Units/hr (03/06/22 0612)   potassium chloride     PRN Meds: sodium chloride, acetaminophen **OR** acetaminophen, guaiFENesin, hydrALAZINE, ipratropium-albuterol, metoprolol tartrate, nitroGLYCERIN, ondansetron **OR** ondansetron (ZOFRAN) IV, senna-docusate, sodium chloride flush, traZODone  Allergies:    Allergies  Allergen Reactions   Sulfonamide Derivatives     REACTION: Pruritic rash    Social History:   Social History   Socioeconomic History   Marital status: Single    Spouse name: Not on file   Number of children: Not on file   Years of education: Not on file   Highest education level: Not on file  Occupational History   Not on file  Tobacco Use   Smoking status: Every Day    Packs/day: 0.50    Types: Cigarettes    Last attempt to quit: 01/29/2012    Years since quitting: 10.1   Smokeless tobacco: Never  Substance and Sexual Activity   Alcohol use: Yes    Alcohol/week: 1.0 standard drink of alcohol    Types: 1 Shots of liquor per week    Comment: Daily   Drug use: No   Sexual activity: Yes    Birth control/protection: Condom  Other Topics Concern   Not on file  Social History Narrative   Not on file   Social Determinants of Health   Financial Resource Strain: Not on file  Food  Insecurity: No Food Insecurity (03/05/2022)   Hunger Vital Sign    Worried About Running Out of Food in the Last Year: Never true    Ran Out of Food in the Last Year: Never true  Transportation Needs: No Transportation Needs (03/05/2022)   PRAPARE - Hydrologist (Medical): No    Lack of Transportation (Non-Medical): No  Physical Activity: Not on file  Stress: Not on file  Social Connections: Not on file  Intimate Partner Violence: Not At Risk (03/05/2022)   Humiliation, Afraid, Rape, and Kick questionnaire    Fear of Current or Ex-Partner: No    Emotionally Abused: No    Physically Abused: No    Sexually Abused: No    Family History:    Family History  Problem Relation Age of Onset   Arthritis Mother    Hypertension Mother    Hypertension Father  Breast cancer Sister    Hypertension Sister    Hypertension Brother    Prostate cancer Brother    Thyroid disease Brother      ROS:  Please see the history of present illness.   All other ROS reviewed and negative.     Physical Exam/Data:   Vitals:   03/05/22 2053 03/06/22 0248 03/06/22 0527 03/06/22 0839  BP: (!) 146/83 124/68 121/71 121/68  Pulse: 71 66 66 62  Resp: 18 20 16 18   Temp: 98.5 F (36.9 C) 98 F (36.7 C) 98 F (36.7 C) 98.5 F (36.9 C)  TempSrc: Oral Oral Oral Oral  SpO2: 100% 95% 99% 100%  Weight:      Height:        Intake/Output Summary (Last 24 hours) at 03/06/2022 0839 Last data filed at 03/06/2022 0612 Gross per 24 hour  Intake 354.09 ml  Output --  Net 354.09 ml      03/05/2022    6:57 PM 03/04/2020    7:55 AM 10/30/2017    6:04 PM  Last 3 Weights  Weight (lbs) 129 lb 137 lb 137 lb  Weight (kg) 58.514 kg 62.143 kg 62.143 kg     Body mass index is 22.49 kg/m.  General:  Well nourished, well developed, in no acute distress. Laying flat in the bed comfortably  HEENT: normal Neck: no JVD Vascular: Radial pulses 2+ bilaterally Cardiac:  normal S1, S2; RRR; no  murmur Lungs:  clear to auscultation bilaterally, no wheezing, rhonchi or rales. Normal WOB on room air  Abd: soft, nontender, no hepatomegaly  Ext: no edema in BLE  Musculoskeletal:  No deformities, BUE and BLE strength normal and equal Skin: warm and dry  Neuro:  CNs 2-12 intact, no focal abnormalities noted Psych:  Normal affect   EKG:  The EKG was personally reviewed and demonstrates:  Sinus rhythm, Hr 89 BPM, nonspecific T wave changes  Telemetry:  Telemetry was personally reviewed and demonstrates:  NSR, HR in the 60s-70s   Relevant CV Studies:   Laboratory Data:  High Sensitivity Troponin:   Recent Labs  Lab 03/05/22 1506 03/05/22 1706  TROPONINIHS 124* 110*     Chemistry Recent Labs  Lab 03/05/22 1514 03/06/22 0105  NA 141 137  K 3.5 3.2*  CL 110 110  CO2 21* 21*  GLUCOSE 106* 102*  BUN 14 17  CREATININE 0.77 0.70  CALCIUM 9.1 8.2*  GFRNONAA >60 >60  ANIONGAP 10 6    Recent Labs  Lab 03/05/22 1514  PROT 8.0  ALBUMIN 4.1  AST 24  ALT 15  ALKPHOS 67  BILITOT 0.5   Lipids  Recent Labs  Lab 03/06/22 0105  CHOL 175  TRIG 64  HDL 43  LDLCALC 119*  CHOLHDL 4.1    Hematology Recent Labs  Lab 03/05/22 1514 03/06/22 0105  WBC 6.8 5.5  RBC 4.80 4.55  HGB 13.7 12.9  HCT 41.9 40.0  MCV 87.3 87.9  MCH 28.5 28.4  MCHC 32.7 32.3  RDW 12.8 13.1  PLT 256 229   Thyroid No results for input(s): "TSH", "FREET4" in the last 168 hours.  BNP Recent Labs  Lab 03/05/22 1514  BNP 26.7    DDimer No results for input(s): "DDIMER" in the last 168 hours.   Radiology/Studies:  DG Chest 2 View  Result Date: 03/05/2022 CLINICAL DATA:  Chest pain. EXAM: CHEST - 2 VIEW COMPARISON:  March 04, 2020. FINDINGS: The heart size and mediastinal contours are within  normal limits. Both lungs are clear. The visualized skeletal structures are unremarkable. IMPRESSION: No active cardiopulmonary disease. Electronically Signed   By: Lupita Raider M.D.   On: 03/05/2022  15:29     Assessment and Plan:   NSTEMI  - Patent presented to the ED on 2/5 complaining of intermittent chest pressure. Chest pressure is located on both sides of her chest and shoulders. Has been progressing for the past 2 months. Worse with exertion, improved with rest. Has been increasing in frequency and intensity, and she has had a few episodes that occurred at rest or on minimal exertion. She has also had progressive dyspnea on exertion and exercise intolerance/fatigue for the past 4-6 weeks.  - Risk factors include tobacco use (smoke approx 1 PPD and has been smoking since she was a teenager), HTN, HTL, family history (sister had stents in her 78s-60s) - hsTn 124>110. EKG with NSR and nonspecific T wave changes - Patient is on IV heparin  - Plan for LHC today  - Continue daily ASA - Lipitor 40 mg daily started this admission -- may need to be increased to 80 mg daily pending cath results  - Plan to start BB after cath  - Echo pending  Shared Decision Making/Informed Consent The risks [stroke (1 in 1000), death (1 in 1000), kidney failure [usually temporary] (1 in 500), bleeding (1 in 200), allergic reaction [possibly serious] (1 in 200)], benefits (diagnostic support and management of coronary artery disease) and alternatives of a cardiac catheterization were discussed in detail with Ms. Sterry and she is willing to proceed.   HLD  - Lipid panel this admission showed LDL 119, HDL 43, triglycerides 64 - Lipitor 40 mg daily started by primary team, may need to be increased pending cath results  - Will need LFTs and lipid panel in 8 weeks   Tobacco Use  - Patient counseled on tobacco cessation - For now, she declines nicotine patch as she is not having cravings. Would like some at DC to help aid cessation   Risk Assessment/Risk Scores:   TIMI Risk Score for Unstable Angina or Non-ST Elevation MI:   The patient's TIMI risk score is 3, which indicates a 13% risk of all cause  mortality, new or recurrent myocardial infarction or need for urgent revascularization in the next 14 days.  For questions or updates, please contact [facility] Please consult [email or web address] for contact info under  Signed, Jonita Albee, PA-C  03/06/2022 8:39 AM   Patient seen and examined, note reviewed with the signed Advanced Practice Provider. I personally reviewed laboratory data, imaging studies and relevant notes. I independently examined the patient and formulated the important aspects of the plan. I have personally discussed the plan with the patient and/or family. Comments or changes to the note/plan are indicated below.  Patient seen and examined at her bedside.  Discussed with patient with given current clinical state and risk factors it is best for proceed with an ischemic evaluation. In this case a left heart cath will be the best option.   Shared Decision Making/Informed Consent The risks [stroke (1 in 1000), death (1 in 1000), kidney failure [usually temporary] (1 in 500), bleeding (1 in 200), allergic reaction [possibly serious] (1 in 200)], benefits (diagnostic support and management of coronary artery disease) and alternatives of a cardiac catheterization were discussed in detail with Ms. Buchan and she is willing to proceed.   For now continue current medication regimen with hep gtt, Aspirin  and statin. Echo pending    Berniece Salines DO, MS [facility] Attending Cardiologist [place]  [address] [place], [place] [address] [phone] Website: [email or web address]

## 2022-03-06 NOTE — Progress Notes (Signed)
Echocardiogram 2D Echocardiogram has been performed.  Haley Turner 03/06/2022, 11:23 AM

## 2022-03-06 NOTE — Interval H&P Note (Signed)
Cath Lab Visit (complete for each Cath Lab visit)  Clinical Evaluation Leading to the Procedure:   ACS: Yes.    Non-ACS:    Anginal Classification: CCS IV  Anti-ischemic medical therapy: Minimal Therapy (1 class of medications)  Non-Invasive Test Results: No non-invasive testing performed  Prior CABG: No previous CABG      History and Physical Interval Note:  03/06/2022 3:17 PM  Haley Turner  has presented today for surgery, with the diagnosis of unstable angina.  The various methods of treatment have been discussed with the patient and family. After consideration of risks, benefits and other options for treatment, the patient has consented to  Procedure(s): LEFT HEART CATH AND CORONARY ANGIOGRAPHY (N/A) as a surgical intervention.  The patient's history has been reviewed, patient examined, no change in status, stable for surgery.  I have reviewed the patient's chart and labs.  Questions were answered to the patient's satisfaction.     Haley Turner

## 2022-03-06 NOTE — Progress Notes (Signed)
PROGRESS NOTE    Haley Turner  AYT:[id number] DOB: 1960-03-24 DOA: 03/05/2022 PCP: Joycelyn Man, FNP   Brief Narrative:  62 year old with history of HTN, HLD, GAD, remote history of myopericarditis 2013, tobacco use comes to the ED for evaluation of chest pain ongoing for 2-3 weeks.  In the ED troponin was noted to be 124 thereafter trending down, chest x-ray was negative.  EKG showed some T wave flattening in the inferior leads.  Patient was given aspirin in the ED, cardiology was consulted who recommended n.p.o. past midnight and IV heparin.  Seen by cardiology recommended left heart cath   Assessment & Plan:  Principal Problem:   NSTEMI (non-ST elevation myocardial infarction) [facility]) Active Problems:   Essential hypertension   Hyperlipidemia   Tobacco use     Assessment and Plan: * NSTEMI (non-ST elevation myocardial infarction) ([place]) Suspicion for NSTEMI, initial troponin 124, trending down.  LDL 119, A1c ordered EKG shows nonspecific T wave changes in the inferior leads.  Cardiology consulted, planning on left heart cath Patient is currently n.p.o. and on IV heparin drip Aspirin and statin Echocardiogram-results pending  Hypokalemia - Repletion   Essential hypertension Recently started on outpatient medicine?.  Currently stable.  Hyperlipidemia Continue atorvastatin.  LDL 119  Tobacco use Smoking 0.5-1 PPD.  Smoking cessation advised.    DVT prophylaxis: Heparin drip Code Status: Full code Family Communication:    Status is: Observation The patient remains OBS appropriate and will d/c before 2 midnights.  Plan left heart catheterization today   Nutritional status          Body mass index is 22.49 kg/m.         Subjective: Seen and examined at bedside.  Tells me her chest pain has eased off now   Examination:  General exam: Appears calm and comfortable  Respiratory system: Clear to auscultation. Respiratory effort  normal. Cardiovascular system: S1 & S2 heard, RRR. No JVD, murmurs, rubs, gallops or clicks. No pedal edema. Gastrointestinal system: Abdomen is nondistended, soft and nontender. No organomegaly or masses felt. Normal bowel sounds heard. Central nervous system: Alert and oriented. No focal neurological deficits. Extremities: Symmetric 5 x 5 power. Skin: No rashes, lesions or ulcers Psychiatry: Judgement and insight appear normal. Mood & affect appropriate.     Objective: Vitals:   03/05/22 1857 03/05/22 2053 03/06/22 0248 03/06/22 0527  BP:  (!) 146/83 124/68 121/71  Pulse:  71 66 66  Resp:  18 20 16   Temp:  98.5 F (36.9 C) 98 F (36.7 C) 98 F (36.7 C)  TempSrc:  Oral Oral Oral  SpO2:  100% 95% 99%  Weight: 58.5 kg     Height: 5' 3.5" (1.613 m)       Intake/Output Summary (Last 24 hours) at 03/06/2022 0751 Last data filed at 03/06/2022 0612 Gross per 24 hour  Intake 354.09 ml  Output --  Net 354.09 ml   Filed Weights   03/05/22 1857  Weight: 58.5 kg     Data Reviewed:   CBC: Recent Labs  Lab 03/05/22 1514 03/06/22 0105  WBC 6.8 5.5  NEUTROABS 3.9  --   HGB 13.7 12.9  HCT 41.9 40.0  MCV 87.3 87.9  PLT 256 355   Basic Metabolic Panel: Recent Labs  Lab 03/05/22 1514 03/06/22 0105  NA 141 137  K 3.5 3.2*  CL 110 110  CO2 21* 21*  GLUCOSE 106* 102*  BUN 14 17  CREATININE 0.77 0.70  CALCIUM  9.1 8.2*   GFR: Estimated Creatinine Clearance: 62.5 mL/min (by C-G formula based on SCr of 0.7 mg/dL). Liver Function Tests: Recent Labs  Lab 03/05/22 1514  AST 24  ALT 15  ALKPHOS 67  BILITOT 0.5  PROT 8.0  ALBUMIN 4.1   Recent Labs  Lab 03/05/22 1514  LIPASE 56*   No results for input(s): "AMMONIA" in the last 168 hours. Coagulation Profile: No results for input(s): "INR", "PROTIME" in the last 168 hours. Cardiac Enzymes: No results for input(s): "CKTOTAL", "CKMB", "CKMBINDEX", "TROPONINI" in the last 168 hours. BNP (last 3 results) No results  for input(s): "PROBNP" in the last 8760 hours. HbA1C: No results for input(s): "HGBA1C" in the last 72 hours. CBG: No results for input(s): "GLUCAP" in the last 168 hours. Lipid Profile: Recent Labs    03/06/22 0105  CHOL 175  HDL 43  LDLCALC 119*  TRIG 64  CHOLHDL 4.1   Thyroid Function Tests: No results for input(s): "TSH", "T4TOTAL", "FREET4", "T3FREE", "THYROIDAB" in the last 72 hours. Anemia Panel: No results for input(s): "VITAMINB12", "FOLATE", "FERRITIN", "TIBC", "IRON", "RETICCTPCT" in the last 72 hours. Sepsis Labs: No results for input(s): "PROCALCITON", "LATICACIDVEN" in the last 168 hours.  Recent Results (from the past 240 hour(s))  Resp panel by RT-PCR (RSV, Flu A&B, Covid) Anterior Nasal Swab     Status: None   Collection Time: 03/05/22  3:31 PM   Specimen: Anterior Nasal Swab  Result Value Ref Range Status   SARS Coronavirus 2 by RT PCR NEGATIVE NEGATIVE Final    Comment: (NOTE) SARS-CoV-2 target nucleic acids are NOT DETECTED.  The SARS-CoV-2 RNA is generally detectable in upper respiratory specimens during the acute phase of infection. The lowest concentration of SARS-CoV-2 viral copies this assay can detect is 138 copies/mL. A negative result does not preclude SARS-Cov-2 infection and should not be used as the sole basis for treatment or other patient management decisions. A negative result may occur with  improper specimen collection/handling, submission of specimen other than nasopharyngeal swab, presence of viral mutation(s) within the areas targeted by this assay, and inadequate number of viral copies(<138 copies/mL). A negative result must be combined with clinical observations, patient history, and epidemiological information. The expected result is Negative.  Fact Sheet for Patients:  EntrepreneurPulse.com.au  Fact Sheet for Healthcare Providers:  IncredibleEmployment.be  This test is no t yet approved or  cleared by the [place] FDA and  has been authorized for detection and/or diagnosis of SARS-CoV-2 by FDA under an Emergency Use Authorization (EUA). This EUA will remain  in effect (meaning this test can be used) for the duration of the COVID-19 declaration under Section 564(b)(1) of the Act, 21 U.S.C.section 360bbb-3(b)(1), unless the authorization is terminated  or revoked sooner.       Influenza A by PCR NEGATIVE NEGATIVE Final   Influenza B by PCR NEGATIVE NEGATIVE Final    Comment: (NOTE) The Xpert Xpress SARS-CoV-2/FLU/RSV plus assay is intended as an aid in the diagnosis of influenza from Nasopharyngeal swab specimens and should not be used as a sole basis for treatment. Nasal washings and aspirates are unacceptable for Xpert Xpress SARS-CoV-2/FLU/RSV testing.  Fact Sheet for Patients: EntrepreneurPulse.com.au  Fact Sheet for Healthcare Providers: IncredibleEmployment.be  This test is not yet approved or cleared by the [place] FDA and has been authorized for detection and/or diagnosis of SARS-CoV-2 by FDA under an Emergency Use Authorization (EUA). This EUA will remain in effect (meaning this test can be used) for  the duration of the COVID-19 declaration under Section 564(b)(1) of the Act, 21 U.S.C. section 360bbb-3(b)(1), unless the authorization is terminated or revoked.     Resp Syncytial Virus by PCR NEGATIVE NEGATIVE Final    Comment: (NOTE) Fact Sheet for Patients: EntrepreneurPulse.com.au  Fact Sheet for Healthcare Providers: IncredibleEmployment.be  This test is not yet approved or cleared by the [place] FDA and has been authorized for detection and/or diagnosis of SARS-CoV-2 by FDA under an Emergency Use Authorization (EUA). This EUA will remain in effect (meaning this test can be used) for the duration of the COVID-19 declaration under Section 564(b)(1) of the Act, 21  U.S.C. section 360bbb-3(b)(1), unless the authorization is terminated or revoked.  Performed at [facility], [place] [address] [place], [place] [address]          Radiology Studies: DG Chest 2 View  Result Date: 03/05/2022 CLINICAL DATA:  Chest pain. EXAM: CHEST - 2 VIEW COMPARISON:  March 04, 2020. FINDINGS: The heart size and mediastinal contours are within normal limits. Both lungs are clear. The visualized skeletal structures are unremarkable. IMPRESSION: No active cardiopulmonary disease. Electronically Signed   By: Marijo Conception M.D.   On: 03/05/2022 15:29        Scheduled Meds:  aspirin EC  81 mg Oral Daily   atorvastatin  40 mg Oral Daily   sodium chloride flush  3 mL Intravenous Q12H   Continuous Infusions:  heparin 850 Units/hr (03/06/22 0612)     LOS: 0 days   Time spent= 35 mins    Vane Yapp Arsenio Loader, MD [facility]  If 7PM-7AM, please contact night-coverage  03/06/2022, 7:51 AM

## 2022-03-06 NOTE — Progress Notes (Signed)
TR BAND REMOVAL  LOCATION:    radial rt radial  DEFLATED PER PROTOCOL:   yes  TIME BAND OFF / DRESSING APPLIED:    1840/gauze and tegaderm  SITE UPON ARRIVAL:    Level 0  SITE AFTER BAND REMOVAL:    Level 0  CIRCULATION SENSATION AND MOVEMENT:    Within Normal Limits : yes, rt hand and fingers warm and pink, good pleth waveform, palpable rt radial pulse  COMMENTS:

## 2022-03-06 NOTE — Progress Notes (Signed)
Pt arriving via [place] from [place], denies cp at time of arrival. Heparin drip running at 8.5 ml/hr.

## 2022-03-07 ENCOUNTER — Other Ambulatory Visit (HOSPITAL_COMMUNITY): Payer: Self-pay

## 2022-03-07 DIAGNOSIS — I214 Non-ST elevation (NSTEMI) myocardial infarction: Secondary | ICD-10-CM | POA: Diagnosis not present

## 2022-03-07 LAB — CBC
HCT: 39.3 % (ref 36.0–46.0)
Hemoglobin: 12.9 g/dL (ref 12.0–15.0)
MCH: 28.7 pg (ref 26.0–34.0)
MCHC: 32.8 g/dL (ref 30.0–36.0)
MCV: 87.3 fL (ref 80.0–100.0)
Platelets: 228 10*3/uL (ref 150–400)
RBC: 4.5 MIL/uL (ref 3.87–5.11)
RDW: 13 % (ref 11.5–15.5)
WBC: 5 10*3/uL (ref 4.0–10.5)
nRBC: 0 % (ref 0.0–0.2)

## 2022-03-07 LAB — BASIC METABOLIC PANEL
Anion gap: 9 (ref 5–15)
BUN: 11 mg/dL (ref 8–23)
CO2: 20 mmol/L — ABNORMAL LOW (ref 22–32)
Calcium: 8.5 mg/dL — ABNORMAL LOW (ref 8.9–10.3)
Chloride: 108 mmol/L (ref 98–111)
Creatinine, Ser: 0.64 mg/dL (ref 0.44–1.00)
GFR, Estimated: >60 mL/min (ref >60)
Glucose, Bld: 90 mg/dL (ref 70–99)
Potassium: 3.6 mmol/L (ref 3.5–5.1)
Sodium: 137 mmol/L (ref 135–145)

## 2022-03-07 LAB — MAGNESIUM: Magnesium: 2.2 mg/dL (ref 1.7–2.4)

## 2022-03-07 MED ORDER — NICOTINE POLACRILEX 2 MG MT GUM
2.0000 mg | CHEWING_GUM | OROMUCOSAL | Status: DC | PRN
  Filled 2022-03-07: qty 1

## 2022-03-07 MED ORDER — NICOTINE 21 MG/24HR TD PT24
21.0000 mg | MEDICATED_PATCH | Freq: Every day | TRANSDERMAL | 0 refills | Status: DC
  Filled 2022-03-07: qty 28, 28d supply, fill #0

## 2022-03-07 MED ORDER — INFLUENZA VAC SPLIT QUAD 0.5 ML IM SUSY
0.5000 mL | PREFILLED_SYRINGE | INTRAMUSCULAR | Status: AC
  Administered 2022-03-07: 0.5 mL via INTRAMUSCULAR
  Filled 2022-03-07: qty 0.5

## 2022-03-07 MED ORDER — CLOPIDOGREL BISULFATE 75 MG PO TABS
75.0000 mg | ORAL_TABLET | Freq: Every day | ORAL | 2 refills | Status: AC
  Filled 2022-03-07: qty 30, 30d supply, fill #0

## 2022-03-07 MED ORDER — ASPIRIN 81 MG PO TBEC
81.0000 mg | DELAYED_RELEASE_TABLET | Freq: Every day | ORAL | 11 refills | Status: AC
  Filled 2022-03-07: qty 30, 30d supply, fill #0

## 2022-03-07 MED ORDER — ATORVASTATIN CALCIUM 40 MG PO TABS
80.0000 mg | ORAL_TABLET | Freq: Every day | ORAL | 1 refills | Status: DC
  Filled 2022-03-07: qty 60, 30d supply, fill #0

## 2022-03-07 MED ORDER — NICOTINE POLACRILEX 2 MG MT GUM
2.0000 mg | CHEWING_GUM | OROMUCOSAL | 0 refills | Status: DC | PRN
  Filled 2022-03-07: qty 100, fill #0

## 2022-03-07 MED ORDER — NICOTINE 21 MG/24HR TD PT24
21.0000 mg | MEDICATED_PATCH | Freq: Every day | TRANSDERMAL | Status: DC
  Administered 2022-03-07: 21 mg via TRANSDERMAL
  Filled 2022-03-07: qty 1

## 2022-03-07 MED ORDER — PNEUMOCOCCAL 20-VAL CONJ VACC 0.5 ML IM SUSY: 0.5000 mL | PREFILLED_SYRINGE | INTRAMUSCULAR | Status: DC

## 2022-03-07 NOTE — Plan of Care (Signed)
  Pt admitted for chest pain evaluation. Arrived unit awake alert an oriented x4. Denies any chest pain or SOB. Able to ambulate in room and  to bathroom with no distress. Placed on tele monitoring. NSR. s/p  heart cath, with right radial size dressing clean, dry and intact. Call bell placed within reach.  Problem: Education: Goal: Knowledge of General Education information will improve Description: Including pain rating scale, medication(s)/side effects and non-pharmacologic comfort measures Outcome: Progressing   Problem: Health Behavior/Discharge Planning: Goal: Ability to manage health-related needs will improve Outcome: Progressing   Problem: Clinical Measurements: Goal: Ability to maintain clinical measurements within normal limits will improve Outcome: Progressing Goal: Will remain free from infection Outcome: Progressing Goal: Diagnostic test results will improve Outcome: Progressing Goal: Respiratory complications will improve Outcome: Progressing Goal: Cardiovascular complication will be avoided Outcome: Progressing   Problem: Activity: Goal: Risk for activity intolerance will decrease Outcome: Progressing   Problem: Nutrition: Goal: Adequate nutrition will be maintained Outcome: Progressing   Problem: Coping: Goal: Level of anxiety will decrease Outcome: Progressing   Problem: Elimination: Goal: Will not experience complications related to bowel motility Outcome: Progressing Goal: Will not experience complications related to urinary retention Outcome: Progressing   Problem: Pain Managment: Goal: General experience of comfort will improve Outcome: Progressing   Problem: Safety: Goal: Ability to remain free from injury will improve Outcome: Progressing   Problem: Skin Integrity: Goal: Risk for impaired skin integrity will decrease Outcome: Progressing   Problem: Education: Goal: Understanding of CV disease, CV risk reduction, and recovery process will  improve Outcome: Progressing Goal: Individualized Educational Video(s) Outcome: Progressing   Problem: Activity: Goal: Ability to return to baseline activity level will improve Outcome: Progressing   Problem: Cardiovascular: Goal: Ability to achieve and maintain adequate cardiovascular perfusion will improve Outcome: Progressing Goal: Vascular access site(s) Level 0-1 will be maintained Outcome: Progressing   Problem: Health Behavior/Discharge Planning: Goal: Ability to safely manage health-related needs after discharge will improve Outcome: Progressing

## 2022-03-07 NOTE — Progress Notes (Signed)
TOC meds delivered to bedside and work note provided to pt.

## 2022-03-07 NOTE — Discharge Summary (Signed)
Physician Discharge Summary  Maudry MayhewDaphne I Meints ZOX:[id number] DOB: 1961/01/08 DOA: 03/05/2022  PCP: Dot BeenYaya, Irene J, FNP  Admit date: 03/05/2022 Discharge date: 03/07/2022  Time spent: 40 minutes  Recommendations for Outpatient Follow-up:  Follow outpatient CBC/CMP  Follow lipid panel outpatient, discharged on lipitor  Encourage smoking cessation Follow with cards outpatient    Discharge Diagnoses:  Principal Problem:   NSTEMI (non-ST elevation myocardial infarction) [facility](HCC) Active Problems:   Essential hypertension   Hyperlipidemia   Tobacco use   NSTEMI (non-ST elevated myocardial infarction) [facility](HCC)   Discharge Condition: stable  Diet recommendation: heart healthy  Filed Weights   03/05/22 1857 03/06/22 2031  Weight: 58.5 kg 60.3 kg    History of present illness:  62 year old with history of HTN, HLD, GAD, remote history of myopericarditis 2013, tobacco use comes to the ED for evaluation of chest pain ongoing for 2-3 weeks.  In the ED troponin was noted to be 124 thereafter trending down, chest x-ray was negative.  EKG showed some T wave flattening in the inferior leads.  Patient was given aspirin in the ED, cardiology was consulted who recommended n.p.o. past midnight and IV heparin.  Seen by cardiology recommended left heart cath   S/p cath showing severe disease in ostial circumflex.  Cards recommending medical therapy.  CP free on day of discharge, encouraged to follow up outpatient.  See below for additional details  Hospital Course:  Assessment and Plan:  NSTEMI (non-ST elevation myocardial infarction) (HCC) Suspicion for NSTEMI, initial troponin 124, trending down.  LDL 119, A1c 5.2. LHC with severe dz in ostial circumflex.  Appears to be ulcerated plaque in ostial circumflex that extends into the distal L main.  Disruption is not flow limiting in the L main or LAD.  Recommending DAPT and high dose statin therapy.   Echo with normal EF, RVSF normal. Will discharge on  aspirin, plavix, lipitor   Hypokalemia Improved on Chavez Rosol day of discharge   Essential hypertension Continue amlodipine at discharge   Hyperlipidemia Continue atorvastatin.  LDL 119 Increase lipitor to 80 mg daily    Tobacco use Smoking 0.5-1 PPD.  Smoking cessation advised. Will d/c with nicotine patches and gum, she desires to quit.    Lesion between toes on R foot  ? Callous vs wart - she's already following up with podiatry Encouraged continued follow up     Procedures: LHC   Prox Cx lesion is 25% stenosed.   Ost Cx lesion is 90% stenosed.   Ost LAD to Mid LAD lesion is 25% stenosed.   Mid LAD lesion is 25% stenosed.   Dist LM lesion is 25% stenosed.   The left ventricular systolic function is normal.   LV end diastolic pressure is normal.   The left ventricular ejection fraction is 55-65% by visual estimate.   There is no aortic valve stenosis.   Severe disease in the ostial circumflex.  Appears to be ulcerated plaque in the ostial circumflex that also extends into the distal left main.  The disruption is not flow-limiting in the left main or LAD.  Due to the location of the stenosis, it is not in Slayde Brault good place for Elyanna Wallick stent.  Milfred Krammes stent would have to extend from the mid left main into the mid circumflex and jailed the LAD.  Given the anatomy, will treat with medication.  300 mg Plavix started here.  Continue dual antiplatelet therapy.  Hopefully, DAPT along with high-dose statin therapy will help the ulceration heal.  Add  antianginal therapy as needed as well.   Voicemail Message left for sister Zata.    Echo IMPRESSIONS     1. Left ventricular ejection fraction by 3D volume is 58 %. The left  ventricle has normal function. The left ventricle has no regional wall  motion abnormalities. Left ventricular diastolic parameters were normal.  The average left ventricular global  longitudinal strain is -19.3 %. The global longitudinal strain is normal.   2. Right ventricular  systolic function is normal. The right ventricular  size is normal. There is normal pulmonary artery systolic pressure.   3. The mitral valve is normal in structure. No evidence of mitral valve  regurgitation. No evidence of mitral stenosis.   4. The aortic valve is normal in structure. Aortic valve regurgitation is  not visualized. No aortic stenosis is present.   5. The inferior vena cava is normal in size with greater than 50%  respiratory variability, suggesting right atrial pressure of 3 mmHg.   Consultations: cardiology  Discharge Exam: Vitals:   03/07/22 0353 03/07/22 0805  BP: 129/63 (!) 124/90  Pulse: 66 60  Resp: 17 16  Temp: 98.7 F (37.1 C) 97.9 F (36.6 C)  SpO2: 98% 100%   Son and friend at bedside No complaints today, she's looking forward to discharging  General: No acute distress. Cardiovascular: RRR Lungs: Clear to auscultation bilaterally  Abdomen: Soft, nontender, nondistended Neurological: Alert and oriented 3. Moves all extremities 4. Cranial nerves II through XII grossly intact. Extremities: No clubbing or cyanosis. No edema.   Discharge Instructions   Discharge Instructions     AMB referral to Phase II Cardiac Rehabilitation   Complete by: As directed    Diagnosis: NSTEMI   After initial evaluation and assessments completed: Virtual Based Care may be provided alone or in conjunction with Phase 2 Cardiac Rehab based on patient barriers.: Yes   Intensive Cardiac Rehabilitation (ICR) [place] location only OR Traditional Cardiac Rehabilitation (TCR) *If criteria for ICR are not met will enroll in TCR [facility] only): Yes   Call MD for:  difficulty breathing, headache or visual disturbances   Complete by: As directed    Call MD for:  extreme fatigue   Complete by: As directed    Call MD for:  hives   Complete by: As directed    Call MD for:  persistant dizziness or light-headedness   Complete by: As directed    Call MD for:  persistant nausea and  vomiting   Complete by: As directed    Call MD for:  redness, tenderness, or signs of infection (pain, swelling, redness, odor or green/yellow discharge around incision site)   Complete by: As directed    Call MD for:  severe uncontrolled pain   Complete by: As directed    Call MD for:  temperature >100.4   Complete by: As directed    Diet - low sodium heart healthy   Complete by: As directed    Discharge instructions   Complete by: As directed    You were seen for chest pain and were diagnosed with an NSTEMI (non ST elevation myocardial infarction or heart attack).  You've now had Daryus Sowash left heart cath and cardiology is recommending medical therapy at this point for your coronary artery disease.    We'll send you home with aspirin and plavix in addition to lipitor.  Please follow up with cardiology as an outpatient.  Smoking increases your risk of heart attack (as well as stroke and  other medical issues).  It's extremely important that you quit to reduce your risk of recurrent and future issues.  I'll send you with Jakyri Brunkhorst prescription for nicotine patches and gum.  You can talk to your PCP about other quitting strategies (there are other medications, like chantix and wellbutrin).    Return for new, recurrent, or worsening symptoms.  Please ask your PCP to request records from this hospitalization so they know what was done and what the next steps will be.   Increase activity slowly   Complete by: As directed       Allergies as of 03/07/2022       Reactions   Sulfonamide Derivatives    REACTION: Pruritic rash        Medication List     TAKE these medications    acetaminophen 325 MG tablet Commonly known as: TYLENOL Take 650 mg by mouth every 6 (six) hours as needed for moderate pain.   amLODipine 5 MG tablet Commonly known as: NORVASC Take 5 mg by mouth daily.   aspirin EC 81 MG tablet Take 1 tablet (81 mg total) by mouth daily. Swallow whole.   atorvastatin 40 MG  tablet Commonly known as: LIPITOR Take 2 tablets (80 mg total) by mouth daily. Follow up with cardiology for repeat labs. Start taking on: March 08, 2022 What changed:  how much to take when to take this additional instructions   clopidogrel 75 MG tablet Commonly known as: PLAVIX Take 1 tablet (75 mg total) by mouth daily with breakfast. Start taking on: March 08, 2022   nicotine 21 mg/24hr patch Commonly known as: NICODERM CQ - dosed in mg/24 hours Place 1 patch (21 mg total) onto the skin daily.   nicotine polacrilex 2 MG gum Commonly known as: NICORETTE Take 1 each (2 mg total) by mouth as needed for smoking cessation.       Allergies  Allergen Reactions   Sulfonamide Derivatives     REACTION: Pruritic rash      The results of significant diagnostics from this hospitalization (including imaging, microbiology, ancillary and laboratory) are listed below for reference.    Significant Diagnostic Studies: CARDIAC CATHETERIZATION  Addendum Date: 03/06/2022     Prox Cx lesion is 25% stenosed.   Ost Cx lesion is 90% stenosed.   Ost LAD to Mid LAD lesion is 25% stenosed.   Mid LAD lesion is 25% stenosed.   Dist LM lesion is 25% stenosed.   The left ventricular systolic function is normal.   LV end diastolic pressure is normal.   The left ventricular ejection fraction is 55-65% by visual estimate.   There is no aortic valve stenosis. Severe disease in the ostial circumflex.  Appears to be ulcerated plaque in the ostial circumflex that also extends into the distal left main.  The disruption is not flow-limiting in the left main or LAD.  Due to the location of the stenosis, it is not in Jahnessa Vanduyn good place for Christhoper Busbee stent.  Joani Cosma stent would have to extend from the mid left main into the mid circumflex and jailed the LAD.  Given the anatomy, will treat with medication.  300 mg Plavix started here.  Continue dual antiplatelet therapy.  Hopefully, DAPT along with high-dose statin therapy will help the  ulceration heal.  Add antianginal therapy as needed as well. Voicemail Message left for sister Zata.   Result Date: 03/06/2022   Prox Cx lesion is 25% stenosed.   Ost Cx lesion is 90%  stenosed.   Ost LAD to Mid LAD lesion is 25% stenosed.   Mid LAD lesion is 25% stenosed.   Dist LM lesion is 25% stenosed.   The left ventricular systolic function is normal.   LV end diastolic pressure is normal.   The left ventricular ejection fraction is 55-65% by visual estimate.   There is no aortic valve stenosis. Severe disease in the ostial circumflex.  Appears to be ulcerated plaque in the ostial circumflex that also extends into the distal left main.  The disruption is not flow-limiting in the left main or LAD.  Due to the location of the stenosis, it is not in Zaeda Mcferran good place for Anneli Bing stent.  Shavon Ashmore stent would have to extend from the mid left main into the mid circumflex and jailed the LAD.  Given the anatomy, will treat with medication.  300 mg Plavix started here.  Continue dual antiplatelet therapy.  Hopefully, DAPT along with high-dose statin therapy will help the ulceration heal.  Add antianginal therapy as needed as well.   ECHOCARDIOGRAM COMPLETE  Result Date: 03/06/2022    ECHOCARDIOGRAM REPORT   Patient Name:   RENLY GUEDES Date of Exam: 03/06/2022 Medical Rec #:  [id number]        Height:       63.5 in Accession #:    [id number]       Weight:       129.0 lb Date of Birth:  [birth year]        BSA:          1.614 m Patient Age:    61 years         BP:           139/69 mmHg Patient Gender: F                HR:           59 bpm. Exam Location:  Inpatient Procedure: 2D Echo, Cardiac Doppler, Color Doppler, Strain Analysis and 3D Echo Indications:    R07.9* Chest pain, unspecified  History:        Patient has no prior history of Echocardiogram examinations.  Sonographer:    Mike Gip Referring Phys: [phone] Wendall Stade IMPRESSIONS  1. Left ventricular ejection fraction by 3D volume is 58 %. The left ventricle has normal  function. The left ventricle has no regional wall motion abnormalities. Left ventricular diastolic parameters were normal. The average left ventricular global longitudinal strain is -19.3 %. The global longitudinal strain is normal.  2. Right ventricular systolic function is normal. The right ventricular size is normal. There is normal pulmonary artery systolic pressure.  3. The mitral valve is normal in structure. No evidence of mitral valve regurgitation. No evidence of mitral stenosis.  4. The aortic valve is normal in structure. Aortic valve regurgitation is not visualized. No aortic stenosis is present.  5. The inferior vena cava is normal in size with greater than 50% respiratory variability, suggesting right atrial pressure of 3 mmHg. FINDINGS  Left Ventricle: Left ventricular ejection fraction by 3D volume is 58 %. The left ventricle has normal function. The left ventricle has no regional wall motion abnormalities. The average left ventricular global longitudinal strain is -19.3 %. The global  longitudinal strain is normal. The left ventricular internal cavity size was normal in size. There is no left ventricular hypertrophy. Left ventricular diastolic parameters were normal. Right Ventricle: The right ventricular size is normal. No increase in right ventricular  wall thickness. Right ventricular systolic function is normal. There is normal pulmonary artery systolic pressure. The tricuspid regurgitant velocity is 2.20 m/s, and  with an assumed right atrial pressure of 3 mmHg, the estimated right ventricular systolic pressure is 22.4 mmHg. Left Atrium: Left atrial size was normal in size. Right Atrium: Right atrial size was normal in size. Pericardium: There is no evidence of pericardial effusion. Mitral Valve: The mitral valve is normal in structure. No evidence of mitral valve regurgitation. No evidence of mitral valve stenosis. Tricuspid Valve: The tricuspid valve is normal in structure. Tricuspid valve  regurgitation is mild . No evidence of tricuspid stenosis. Aortic Valve: The aortic valve is normal in structure. Aortic valve regurgitation is not visualized. No aortic stenosis is present. Pulmonic Valve: The pulmonic valve was normal in structure. Pulmonic valve regurgitation is not visualized. No evidence of pulmonic stenosis. Aorta: The aortic root is normal in size and structure. Venous: The inferior vena cava is normal in size with greater than 50% respiratory variability, suggesting right atrial pressure of 3 mmHg. IAS/Shunts: No atrial level shunt detected by color flow Doppler.  LEFT VENTRICLE PLAX 2D LVIDd:         3.70 cm         Diastology LVIDs:         2.10 cm         LV e' medial:    8.59 cm/s LV PW:         1.10 cm         LV E/e' medial:  8.8 LV IVS:        0.90 cm         LV e' lateral:   11.00 cm/s LVOT diam:     1.90 cm         LV E/e' lateral: 6.9 LV SV:         55 LV SV Index:   34              2D LVOT Area:     2.84 cm        Longitudinal                                Strain                                2D Strain GLS  -19.3 % LV Volumes (MOD)               Avg: LV vol d, MOD    88.3 ml A2C:                           3D Volume EF LV vol d, MOD    88.5 ml       LV 3D EF:    Left A4C:                                        ventricul LV vol s, MOD    29.5 ml                    ar A2C:  ejection LV vol s, MOD    36.3 ml                    fraction A4C:                                        by 3D LV SV MOD A2C:   58.8 ml                    volume is LV SV MOD A4C:   88.5 ml                    58 %. LV SV MOD BP:    54.6 ml                                 3D Volume EF:                                3D EF:        58 %                                LV EDV:       106 ml                                LV ESV:       44 ml                                LV SV:        62 ml RIGHT VENTRICLE             IVC RV Basal diam:  3.30 cm     IVC diam: 1.20 cm RV S prime:      11.50 cm/s TAPSE (M-mode): 1.8 cm LEFT ATRIUM             Index        RIGHT ATRIUM           Index LA diam:        3.30 cm 2.04 cm/m   RA Area:     10.90 cm LA Vol (A2C):   48.5 ml 30.04 ml/m  RA Volume:   20.90 ml  12.95 ml/m LA Vol (A4C):   40.3 ml 24.96 ml/m LA Biplane Vol: 44.9 ml 27.81 ml/m  AORTIC VALVE LVOT Vmax:   88.30 cm/s LVOT Vmean:  56.100 cm/s LVOT VTI:    0.195 m  AORTA Ao Root diam: 2.80 cm Ao Asc diam:  3.00 cm MITRAL VALVE               TRICUSPID VALVE MV Area (PHT): 4.71 cm    TR Peak grad:   19.4 mmHg MV Decel Time: 161 msec    TR Vmax:        220.00 cm/s MV E velocity: 75.50 cm/s MV [place] velocity: 46.50 cm/s  SHUNTS MV E/Lanyla Costello ratio:  1.62        Systemic VTI:  0.20 m  Systemic Diam: 1.90 cm Sanda Klein MD Electronically signed by Sanda Klein MD Signature Date/Time: 03/06/2022/12:45:33 PM    Final    DG Chest 2 View  Result Date: 03/05/2022 CLINICAL DATA:  Chest pain. EXAM: CHEST - 2 VIEW COMPARISON:  March 04, 2020. FINDINGS: The heart size and mediastinal contours are within normal limits. Both lungs are clear. The visualized skeletal structures are unremarkable. IMPRESSION: No active cardiopulmonary disease. Electronically Signed   By: Marijo Conception M.D.   On: 03/05/2022 15:29    Microbiology: Recent Results (from the past 240 hour(s))  Resp panel by RT-PCR (RSV, Flu Vista Sawatzky&B, Covid) Anterior Nasal Swab     Status: None   Collection Time: 03/05/22  3:31 PM   Specimen: Anterior Nasal Swab  Result Value Ref Range Status   SARS Coronavirus 2 by RT PCR NEGATIVE NEGATIVE Final    Comment: (NOTE) SARS-CoV-2 target nucleic acids are NOT DETECTED.  The SARS-CoV-2 RNA is generally detectable in upper respiratory specimens during the acute phase of infection. The lowest concentration of SARS-CoV-2 viral copies this assay can detect is 138 copies/mL. Kiril Hippe negative result does not preclude SARS-Cov-2 infection and should not be used as the sole basis for  treatment or other patient management decisions. Adira Limburg negative result may occur with  improper specimen collection/handling, submission of specimen other than nasopharyngeal swab, presence of viral mutation(s) within the areas targeted by this assay, and inadequate number of viral copies(<138 copies/mL). Patrich Heinze negative result must be combined with clinical observations, patient history, and epidemiological information. The expected result is Negative.  Fact Sheet for Patients:  EntrepreneurPulse.com.au  Fact Sheet for Healthcare Providers:  IncredibleEmployment.be  This test is no t yet approved or cleared by the [place] FDA and  has been authorized for detection and/or diagnosis of SARS-CoV-2 by FDA under an Emergency Use Authorization (EUA). This EUA will remain  in effect (meaning this test can be used) for the duration of the COVID-19 declaration under Section 564(b)(1) of the Act, 21 U.S.C.section 360bbb-3(b)(1), unless the authorization is terminated  or revoked sooner.       Influenza Jaki Steptoe by PCR NEGATIVE NEGATIVE Final   Influenza B by PCR NEGATIVE NEGATIVE Final    Comment: (NOTE) The Xpert Xpress SARS-CoV-2/FLU/RSV plus assay is intended as an aid in the diagnosis of influenza from Nasopharyngeal swab specimens and should not be used as Karston Hyland sole basis for treatment. Nasal washings and aspirates are unacceptable for Xpert Xpress SARS-CoV-2/FLU/RSV testing.  Fact Sheet for Patients: EntrepreneurPulse.com.au  Fact Sheet for Healthcare Providers: IncredibleEmployment.be  This test is not yet approved or cleared by the [place] FDA and has been authorized for detection and/or diagnosis of SARS-CoV-2 by FDA under an Emergency Use Authorization (EUA). This EUA will remain in effect (meaning this test can be used) for the duration of the COVID-19 declaration under Section 564(b)(1) of the Act, 21  U.S.C. section 360bbb-3(b)(1), unless the authorization is terminated or revoked.     Resp Syncytial Virus by PCR NEGATIVE NEGATIVE Final    Comment: (NOTE) Fact Sheet for Patients: EntrepreneurPulse.com.au  Fact Sheet for Healthcare Providers: IncredibleEmployment.be  This test is not yet approved or cleared by the [place] FDA and has been authorized for detection and/or diagnosis of SARS-CoV-2 by FDA under an Emergency Use Authorization (EUA). This EUA will remain in effect (meaning this test can be used) for the duration of the COVID-19 declaration under Section 564(b)(1) of the Act, 21 U.S.C. section 360bbb-3(b)(1), unless the  authorization is terminated or revoked.  Performed at [facility], [address], [place], [place] [address]      Labs: Basic Metabolic Panel: Recent Labs  Lab 03/05/22 1514 03/06/22 0105 03/07/22 0307  NA 141 137 137  K 3.5 3.2* 3.6  CL 110 110 108  CO2 21* 21* 20*  GLUCOSE 106* 102* 90  BUN 14 17 11   CREATININE 0.77 0.70 0.64  CALCIUM 9.1 8.2* 8.5*  MG  --   --  2.2   Liver Function Tests: Recent Labs  Lab 03/05/22 1514  AST 24  ALT 15  ALKPHOS 67  BILITOT 0.5  PROT 8.0  ALBUMIN 4.1   Recent Labs  Lab 03/05/22 1514  LIPASE 56*   No results for input(s): "AMMONIA" in the last 168 hours. CBC: Recent Labs  Lab 03/05/22 1514 03/06/22 0105 03/07/22 0307  WBC 6.8 5.5 5.0  NEUTROABS 3.9  --   --   HGB 13.7 12.9 12.9  HCT 41.9 40.0 39.3  MCV 87.3 87.9 87.3  PLT 256 229 228   Cardiac Enzymes: No results for input(s): "CKTOTAL", "CKMB", "CKMBINDEX", "TROPONINI" in the last 168 hours. BNP: BNP (last 3 results) Recent Labs    03/05/22 1514  BNP 26.7    ProBNP (last 3 results) No results for input(s): "PROBNP" in the last 8760 hours.  CBG: No results for input(s): "GLUCAP" in the last 168 hours.     Signed:  05/04/22 MD.  [facility] 03/07/2022, 11:49 AM

## 2022-03-07 NOTE — Progress Notes (Signed)
CARDIAC REHAB PHASE I   Post MI/cath education including site care, restrictions, risk factors, heart healthy diet, exercise guidelines, antiplatelet therapy importance, MI booklet and CRP2 reviewed. All questions and concerns addressed. Will refer to [facility] for CRP2. Pt reports ambulating in room, bathed and  cleaned up her room. Tolerated well with no CP or SOB. Plan for home today.   1100-1200  Vanessa Barbara, RN BSN 03/07/2022 11:58 AM

## 2022-03-07 NOTE — Progress Notes (Signed)
Progress Note  Patient Name: Haley Turner Date of Encounter: 03/07/2022  Primary Cardiologist: Thomasene Ripple, DO   Subjective   Patient seen examined her bedside.  She is sitting up in bed eating her breakfast when I arrived.  No complaints at this time.  She is status post left heart catheterization yesterday.  Inpatient Medications    Scheduled Meds:  aspirin  81 mg Oral Daily   atorvastatin  40 mg Oral Daily   clopidogrel  75 mg Oral Q breakfast   [START ON 03/08/2022] influenza vac split quadrivalent PF  0.5 mL Intramuscular Tomorrow-1000   [START ON 03/08/2022] pneumococcal 20-valent conjugate vaccine  0.5 mL Intramuscular Tomorrow-1000   potassium chloride  40 mEq Oral Once   sodium chloride flush  3 mL Intravenous Q12H   sodium chloride flush  3 mL Intravenous Q12H   sodium chloride flush  3 mL Intravenous Q12H   Continuous Infusions:  sodium chloride     PRN Meds: sodium chloride, acetaminophen **OR** acetaminophen, acetaminophen, guaiFENesin, hydrALAZINE, ipratropium-albuterol, metoprolol tartrate, nitroGLYCERIN, ondansetron **OR** ondansetron (ZOFRAN) IV, ondansetron (ZOFRAN) IV, senna-docusate, sodium chloride flush, traZODone   Vital Signs    Vitals:   03/06/22 1920 03/06/22 1950 03/06/22 2031 03/07/22 0353  BP: (!) 120/48 (!) 120/57 116/65 129/63  Pulse: 77 74 63 66  Resp: 17 15 16 17   Temp:   98.8 F (37.1 C) 98.7 F (37.1 C)  TempSrc:   Oral Oral  SpO2: 100% 100% 96% 98%  Weight:   60.3 kg   Height:   5\' 3"  (1.6 m)     Intake/Output Summary (Last 24 hours) at 03/07/2022 0757 Last data filed at 03/06/2022 1009 Gross per 24 hour  Intake --  Output 100 ml  Net -100 ml   Filed Weights   03/05/22 1857 03/06/22 2031  Weight: 58.5 kg 60.3 kg    Telemetry    Sinus rhythm - Personally Reviewed  ECG    None  - Personally Reviewed  Physical Exam    General: Comfortable, sitting up in a chair Head: Atraumatic, normal size  Eyes: PEERLA, EOMI   Neck: Supple, normal JVD Cardiac: Normal S1, S2; RRR; no murmurs, rubs, or gallops Lungs: Clear to auscultation bilaterally Abd: Soft, nontender, no hepatomegaly  Ext: warm, no edema Musculoskeletal: No deformities, BUE and BLE strength normal and equal Skin: Warm and dry, no rashes   Neuro: Alert and oriented to person, place, time, and situation, CNII-XII grossly intact, no focal deficits  Psych: Normal mood and affect   Labs    Chemistry Recent Labs  Lab 03/05/22 1514 03/06/22 0105 03/07/22 0307  NA 141 137 137  K 3.5 3.2* 3.6  CL 110 110 108  CO2 21* 21* 20*  GLUCOSE 106* 102* 90  BUN 14 17 11   CREATININE 0.77 0.70 0.64  CALCIUM 9.1 8.2* 8.5*  PROT 8.0  --   --   ALBUMIN 4.1  --   --   AST 24  --   --   ALT 15  --   --   ALKPHOS 67  --   --   BILITOT 0.5  --   --   GFRNONAA >60 >60 >60  ANIONGAP 10 6 9      Hematology Recent Labs  Lab 03/05/22 1514 03/06/22 0105 03/07/22 0307  WBC 6.8 5.5 5.0  RBC 4.80 4.55 4.50  HGB 13.7 12.9 12.9  HCT 41.9 40.0 39.3  MCV 87.3 87.9 87.3  MCH 28.5 28.4  28.7  MCHC 32.7 32.3 32.8  RDW 12.8 13.1 13.0  PLT 256 229 228    Cardiac EnzymesNo results for input(s): "TROPONINI" in the last 168 hours. No results for input(s): "TROPIPOC" in the last 168 hours.   BNP Recent Labs  Lab 03/05/22 1514  BNP 26.7     DDimer No results for input(s): "DDIMER" in the last 168 hours.   Radiology    CARDIAC CATHETERIZATION  Addendum Date: 03/06/2022     Prox Cx lesion is 25% stenosed.   Ost Cx lesion is 90% stenosed.   Ost LAD to Mid LAD lesion is 25% stenosed.   Mid LAD lesion is 25% stenosed.   Dist LM lesion is 25% stenosed.   The left ventricular systolic function is normal.   LV end diastolic pressure is normal.   The left ventricular ejection fraction is 55-65% by visual estimate.   There is no aortic valve stenosis. Severe disease in the ostial circumflex.  Appears to be ulcerated plaque in the ostial circumflex that also  extends into the distal left main.  The disruption is not flow-limiting in the left main or LAD.  Due to the location of the stenosis, it is not in a good place for a stent.  A stent would have to extend from the mid left main into the mid circumflex and jailed the LAD.  Given the anatomy, will treat with medication.  300 mg Plavix started here.  Continue dual antiplatelet therapy.  Hopefully, DAPT along with high-dose statin therapy will help the ulceration heal.  Add antianginal therapy as needed as well. Voicemail Message left for sister Zata.   Result Date: 03/06/2022   Prox Cx lesion is 25% stenosed.   Ost Cx lesion is 90% stenosed.   Ost LAD to Mid LAD lesion is 25% stenosed.   Mid LAD lesion is 25% stenosed.   Dist LM lesion is 25% stenosed.   The left ventricular systolic function is normal.   LV end diastolic pressure is normal.   The left ventricular ejection fraction is 55-65% by visual estimate.   There is no aortic valve stenosis. Severe disease in the ostial circumflex.  Appears to be ulcerated plaque in the ostial circumflex that also extends into the distal left main.  The disruption is not flow-limiting in the left main or LAD.  Due to the location of the stenosis, it is not in a good place for a stent.  A stent would have to extend from the mid left main into the mid circumflex and jailed the LAD.  Given the anatomy, will treat with medication.  300 mg Plavix started here.  Continue dual antiplatelet therapy.  Hopefully, DAPT along with high-dose statin therapy will help the ulceration heal.  Add antianginal therapy as needed as well.   ECHOCARDIOGRAM COMPLETE  Result Date: 03/06/2022    ECHOCARDIOGRAM REPORT   Patient Name:   Haley Turner Date of Exam: 03/06/2022 Medical Rec #:  [id number]        Height:       63.5 in Accession #:    [id number]       Weight:       129.0 lb Date of Birth:  09-Jan-1961        BSA:          1.614 m Patient Age:    62 years         BP:           139/69  mmHg  Patient Gender: F                HR:           59 bpm. Exam Location:  Inpatient Procedure: 2D Echo, Cardiac Doppler, Color Doppler, Strain Analysis and 3D Echo Indications:    R07.9* Chest pain, unspecified  History:        Patient has no prior history of Echocardiogram examinations.  Sonographer:    Phineas Douglas Referring Phys: Concepcion  1. Left ventricular ejection fraction by 3D volume is 58 %. The left ventricle has normal function. The left ventricle has no regional wall motion abnormalities. Left ventricular diastolic parameters were normal. The average left ventricular global longitudinal strain is -19.3 %. The global longitudinal strain is normal.  2. Right ventricular systolic function is normal. The right ventricular size is normal. There is normal pulmonary artery systolic pressure.  3. The mitral valve is normal in structure. No evidence of mitral valve regurgitation. No evidence of mitral stenosis.  4. The aortic valve is normal in structure. Aortic valve regurgitation is not visualized. No aortic stenosis is present.  5. The inferior vena cava is normal in size with greater than 50% respiratory variability, suggesting right atrial pressure of 3 mmHg. FINDINGS  Left Ventricle: Left ventricular ejection fraction by 3D volume is 58 %. The left ventricle has normal function. The left ventricle has no regional wall motion abnormalities. The average left ventricular global longitudinal strain is -19.3 %. The global  longitudinal strain is normal. The left ventricular internal cavity size was normal in size. There is no left ventricular hypertrophy. Left ventricular diastolic parameters were normal. Right Ventricle: The right ventricular size is normal. No increase in right ventricular wall thickness. Right ventricular systolic function is normal. There is normal pulmonary artery systolic pressure. The tricuspid regurgitant velocity is 2.20 m/s, and  with an assumed right atrial  pressure of 3 mmHg, the estimated right ventricular systolic pressure is 47.4 mmHg. Left Atrium: Left atrial size was normal in size. Right Atrium: Right atrial size was normal in size. Pericardium: There is no evidence of pericardial effusion. Mitral Valve: The mitral valve is normal in structure. No evidence of mitral valve regurgitation. No evidence of mitral valve stenosis. Tricuspid Valve: The tricuspid valve is normal in structure. Tricuspid valve regurgitation is mild . No evidence of tricuspid stenosis. Aortic Valve: The aortic valve is normal in structure. Aortic valve regurgitation is not visualized. No aortic stenosis is present. Pulmonic Valve: The pulmonic valve was normal in structure. Pulmonic valve regurgitation is not visualized. No evidence of pulmonic stenosis. Aorta: The aortic root is normal in size and structure. Venous: The inferior vena cava is normal in size with greater than 50% respiratory variability, suggesting right atrial pressure of 3 mmHg. IAS/Shunts: No atrial level shunt detected by color flow Doppler.  LEFT VENTRICLE PLAX 2D LVIDd:         3.70 cm         Diastology LVIDs:         2.10 cm         LV e' medial:    8.59 cm/s LV PW:         1.10 cm         LV E/e' medial:  8.8 LV IVS:        0.90 cm         LV e' lateral:   11.00 cm/s LVOT diam:  1.90 cm         LV E/e' lateral: 6.9 LV SV:         55 LV SV Index:   34              2D LVOT Area:     2.84 cm        Longitudinal                                Strain                                2D Strain GLS  -19.3 % LV Volumes (MOD)               Avg: LV vol d, MOD    88.3 ml A2C:                           3D Volume EF LV vol d, MOD    88.5 ml       LV 3D EF:    Left A4C:                                        ventricul LV vol s, MOD    29.5 ml                    ar A2C:                                        ejection LV vol s, MOD    36.3 ml                    fraction A4C:                                        by 3D LV SV MOD  A2C:   58.8 ml                    volume is LV SV MOD A4C:   88.5 ml                    58 %. LV SV MOD BP:    54.6 ml                                 3D Volume EF:                                3D EF:        58 %                                LV EDV:       106 ml  LV ESV:       44 ml                                LV SV:        62 ml RIGHT VENTRICLE             IVC RV Basal diam:  3.30 cm     IVC diam: 1.20 cm RV S prime:     11.50 cm/s TAPSE (M-mode): 1.8 cm LEFT ATRIUM             Index        RIGHT ATRIUM           Index LA diam:        3.30 cm 2.04 cm/m   RA Area:     10.90 cm LA Vol (A2C):   48.5 ml 30.04 ml/m  RA Volume:   20.90 ml  12.95 ml/m LA Vol (A4C):   40.3 ml 24.96 ml/m LA Biplane Vol: 44.9 ml 27.81 ml/m  AORTIC VALVE LVOT Vmax:   88.30 cm/s LVOT Vmean:  56.100 cm/s LVOT VTI:    0.195 m  AORTA Ao Root diam: 2.80 cm Ao Asc diam:  3.00 cm MITRAL VALVE               TRICUSPID VALVE MV Area (PHT): 4.71 cm    TR Peak grad:   19.4 mmHg MV Decel Time: 161 msec    TR Vmax:        220.00 cm/s MV E velocity: 75.50 cm/s MV A velocity: 46.50 cm/s  SHUNTS MV E/A ratio:  1.62        Systemic VTI:  0.20 m                            Systemic Diam: 1.90 cm Rachelle Hora Croitoru MD Electronically signed by Thurmon Fair MD Signature Date/Time: 03/06/2022/12:45:33 PM    Final    DG Chest 2 View  Result Date: 03/05/2022 CLINICAL DATA:  Chest pain. EXAM: CHEST - 2 VIEW COMPARISON:  March 04, 2020. FINDINGS: The heart size and mediastinal contours are within normal limits. Both lungs are clear. The visualized skeletal structures are unremarkable. IMPRESSION: No active cardiopulmonary disease. Electronically Signed   By: Lupita Raider M.D.   On: 03/05/2022 15:29    Cardiac Studies   LHC 03/07/2022   Prox Cx lesion is 25% stenosed.   Ost Cx lesion is 90% stenosed.   Ost LAD to Mid LAD lesion is 25% stenosed.   Mid LAD lesion is 25% stenosed.   Dist LM lesion is 25% stenosed.    The left ventricular systolic function is normal.   LV end diastolic pressure is normal.   The left ventricular ejection fraction is 55-65% by visual estimate.   There is no aortic valve stenosis.   Severe disease in the ostial circumflex.  Appears to be ulcerated plaque in the ostial circumflex that also extends into the distal left main.  The disruption is not flow-limiting in the left main or LAD.  Due to the location of the stenosis, it is not in a good place for a stent.  A stent would have to extend from the mid left main into the mid circumflex and jailed the LAD.  Given the anatomy, will treat with medication.  300 mg Plavix started here.  Continue dual  antiplatelet therapy.  Hopefully, DAPT along with high-dose statin therapy will help the ulceration heal.  Add antianginal therapy as needed as well.  TTE 03/07/2022 IMPRESSIONS     1. Left ventricular ejection fraction by 3D volume is 58 %. The left  ventricle has normal function. The left ventricle has no regional wall  motion abnormalities. Left ventricular diastolic parameters were normal.  The average left ventricular global  longitudinal strain is -19.3 %. The global longitudinal strain is normal.   2. Right ventricular systolic function is normal. The right ventricular  size is normal. There is normal pulmonary artery systolic pressure.   3. The mitral valve is normal in structure. No evidence of mitral valve  regurgitation. No evidence of mitral stenosis.   4. The aortic valve is normal in structure. Aortic valve regurgitation is  not visualized. No aortic stenosis is present.   5. The inferior vena cava is normal in size with greater than 50%  respiratory variability, suggesting right atrial pressure of 3 mmHg.   FINDINGS   Left Ventricle: Left ventricular ejection fraction by 3D volume is 58 %.  The left ventricle has normal function. The left ventricle has no regional  wall motion abnormalities. The average left  ventricular global  longitudinal strain is -19.3 %. The global   longitudinal strain is normal. The left ventricular internal cavity size  was normal in size. There is no left ventricular hypertrophy. Left  ventricular diastolic parameters were normal.   Right Ventricle: The right ventricular size is normal. No increase in  right ventricular wall thickness. Right ventricular systolic function is  normal. There is normal pulmonary artery systolic pressure. The tricuspid  regurgitant velocity is 2.20 m/s, and   with an assumed right atrial pressure of 3 mmHg, the estimated right  ventricular systolic pressure is 22.4 mmHg.   Left Atrium: Left atrial size was normal in size.   Right Atrium: Right atrial size was normal in size.   Pericardium: There is no evidence of pericardial effusion.   Mitral Valve: The mitral valve is normal in structure. No evidence of  mitral valve regurgitation. No evidence of mitral valve stenosis.   Tricuspid Valve: The tricuspid valve is normal in structure. Tricuspid  valve regurgitation is mild . No evidence of tricuspid stenosis.   Aortic Valve: The aortic valve is normal in structure. Aortic valve  regurgitation is not visualized. No aortic stenosis is present.   Pulmonic Valve: The pulmonic valve was normal in structure. Pulmonic valve  regurgitation is not visualized. No evidence of pulmonic stenosis.   Aorta: The aortic root is normal in size and structure.   Venous: The inferior vena cava is normal in size with greater than 50%  respiratory variability, suggesting right atrial pressure of 3 mmHg.   IAS/Shunts: No atrial level shunt detected by color flow Doppler.    Patient Profile     62 y.o. female  hx of HTN, HLD, anxiety, depression, history of myopericarditis in 2013, tobacco use presented with chest pain  Assessment & Plan    Coronary artery disease Hyperlipidemia Tobacco use  She is status post left heart catheterization  yesterday with diffuse disease however in the location of her disease it is very difficult for PCI's stenting that lesion would have led to the LAD being jailed therefore at this point we will proceed with medical management.  She is on Plavix and aspirin.  High-dose statin as well.  If his symptoms does not improve we will  add antianginal in the outpatient setting.  But as of right now she is not experiencing any chest discomfort.  We discussed at length today about tobacco use.  She is ready to quit smoking.  Continue her statin goal less than 55 for her LDL at this time.  She will follow-up with me in the outpatient setting.       For questions or updates, please contact [place] Please consult [email or web address] for contact info under Cardiology/STEMI.      Signed, Berniece Salines, DO  03/07/2022, 7:57 AM

## 2022-03-07 NOTE — Plan of Care (Signed)

## 2022-03-08 LAB — LIPOPROTEIN A (LPA): Lipoprotein (a): 238.8 nmol/L — ABNORMAL HIGH (ref <75.0)

## 2022-03-09 MED FILL — Verapamil HCl IV Soln 2.5 MG/ML: INTRAVENOUS | Qty: 2 | Status: AC

## 2022-03-12 DIAGNOSIS — F419 Anxiety disorder, unspecified: Secondary | ICD-10-CM | POA: Diagnosis not present

## 2022-03-12 DIAGNOSIS — R252 Cramp and spasm: Secondary | ICD-10-CM | POA: Diagnosis not present

## 2022-03-12 DIAGNOSIS — Z13820 Encounter for screening for osteoporosis: Secondary | ICD-10-CM | POA: Diagnosis not present

## 2022-03-12 DIAGNOSIS — F330 Major depressive disorder, recurrent, mild: Secondary | ICD-10-CM | POA: Diagnosis not present

## 2022-03-12 DIAGNOSIS — E876 Hypokalemia: Secondary | ICD-10-CM | POA: Diagnosis not present

## 2022-03-12 DIAGNOSIS — R079 Chest pain, unspecified: Secondary | ICD-10-CM | POA: Diagnosis not present

## 2022-03-12 DIAGNOSIS — I214 Non-ST elevation (NSTEMI) myocardial infarction: Secondary | ICD-10-CM | POA: Diagnosis not present

## 2022-03-12 DIAGNOSIS — I10 Essential (primary) hypertension: Secondary | ICD-10-CM | POA: Diagnosis not present

## 2022-03-13 ENCOUNTER — Other Ambulatory Visit: Payer: Self-pay | Admitting: Family Medicine

## 2022-03-13 DIAGNOSIS — I10 Essential (primary) hypertension: Secondary | ICD-10-CM | POA: Diagnosis not present

## 2022-03-13 DIAGNOSIS — I214 Non-ST elevation (NSTEMI) myocardial infarction: Secondary | ICD-10-CM | POA: Diagnosis not present

## 2022-03-13 DIAGNOSIS — R079 Chest pain, unspecified: Secondary | ICD-10-CM | POA: Diagnosis not present

## 2022-03-13 DIAGNOSIS — E876 Hypokalemia: Secondary | ICD-10-CM | POA: Diagnosis not present

## 2022-03-13 DIAGNOSIS — R252 Cramp and spasm: Secondary | ICD-10-CM

## 2022-03-13 IMAGING — CR DG CHEST 1V PORT
1 series · 1 of 1 positions shown · non-contrast
Comparison: Portable exam 5983 hours compared to 09/04/2015

CLINICAL DATA: Chest pain into LEFT shoulder, headache at posterior
head, hypertension, smoker

EXAM:
PORTABLE CHEST 1 VIEW

[x chest ap]
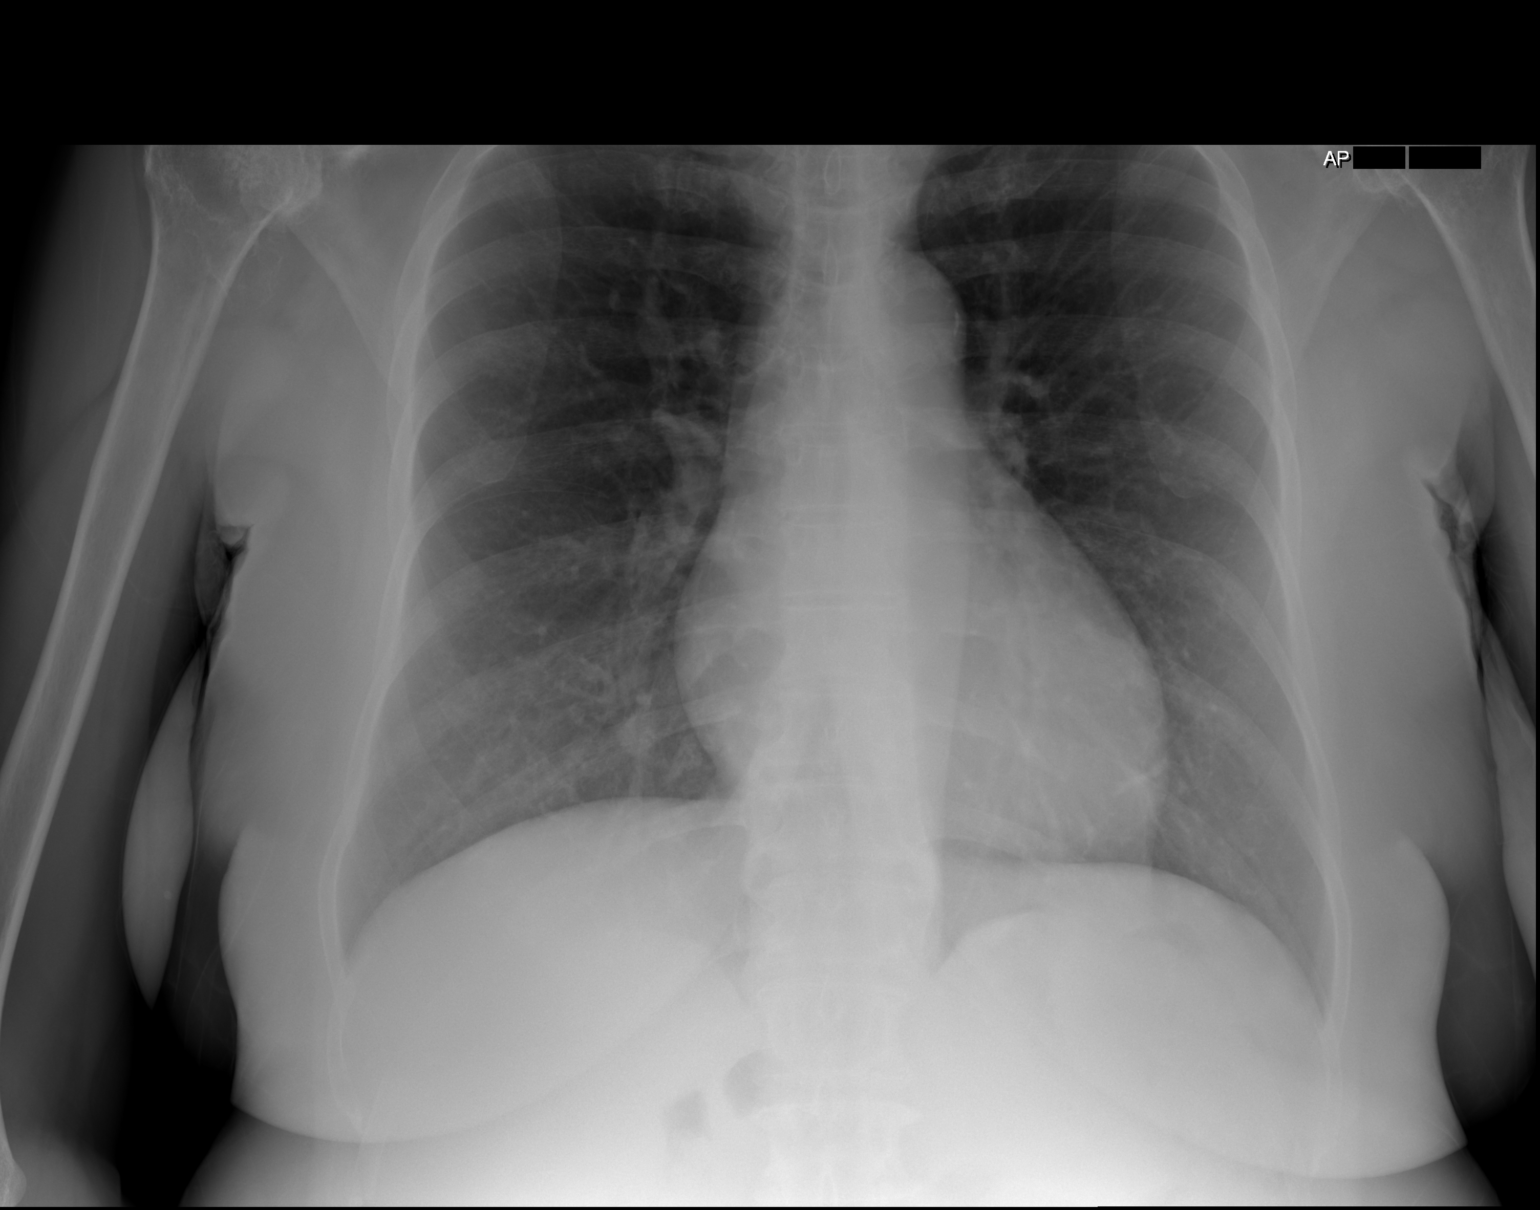

[1 of 1 positions shown; findings below may reference images not displayed]

FINDINGS: Normal heart size, mediastinal contours, and pulmonary vascularity.

Lungs clear.

No infiltrate, pleural effusion, or pneumothorax.

Minimal atherosclerotic calcification aortic arch.

RIGHT glenohumeral degenerative changes.
IMPRESSION: No acute abnormalities.

Aortic Atherosclerosis ([id number]).

## 2022-03-13 IMAGING — CT CT HEAD W/O CM
3 series · 16 of 47 positions shown, 19 images · non-contrast
Comparison: None

CLINICAL DATA: Awoke with posterior headache today LEFT hand
weakness, question stroke

EXAM:
CT HEAD WITHOUT CONTRAST
TECHNIQUE: Contiguous axial images were obtained from the base of the skull
through the vertex without intravenous contrast. Sagittal and
coronal MPR images reconstructed from axial data set.

[Series 2: head wo · axial · 0.47mm/px · z∈[-126,-1]mm · 10 of 31 slices shown, 13 images]
[im 3/31  brain]
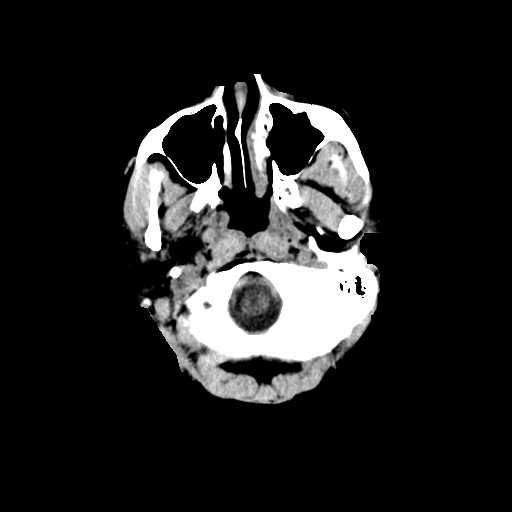
[im 3/31  bone]
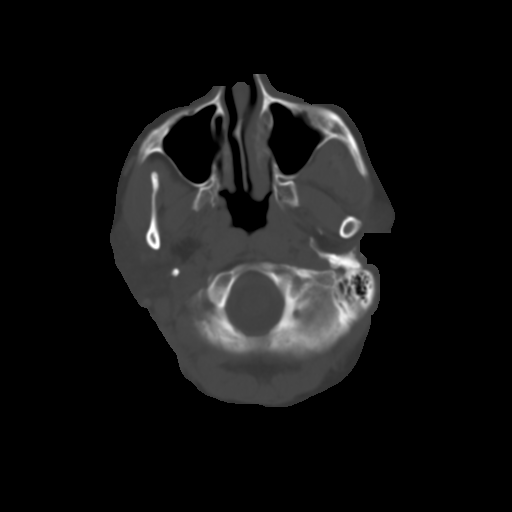
[im 6/31  brain]
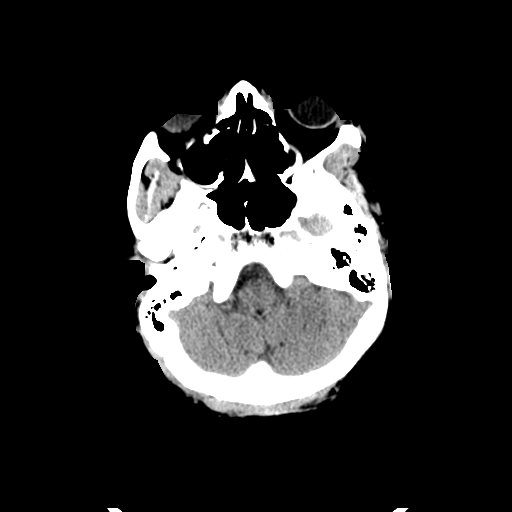
[im 9/31  brain]
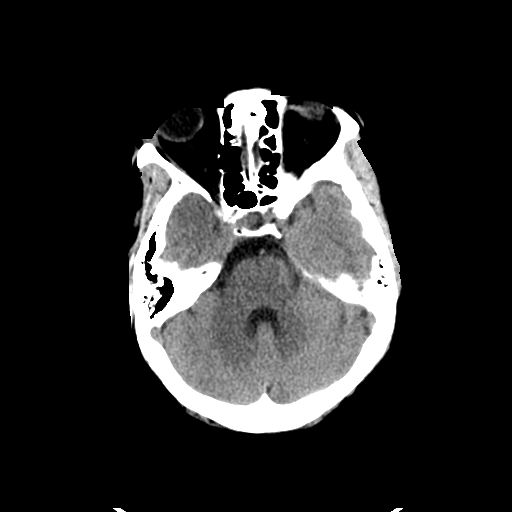
[im 11/31  brain]
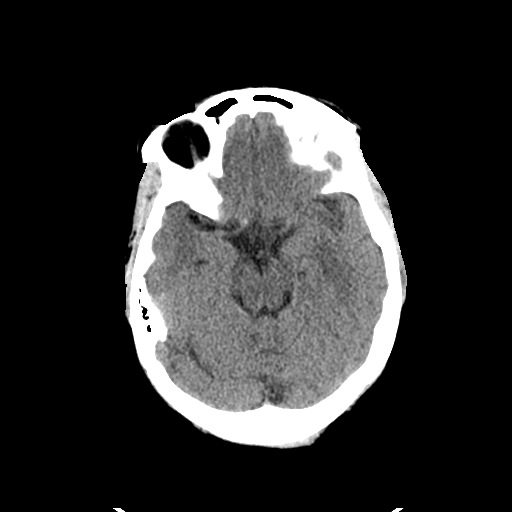
[im 14/31  brain]
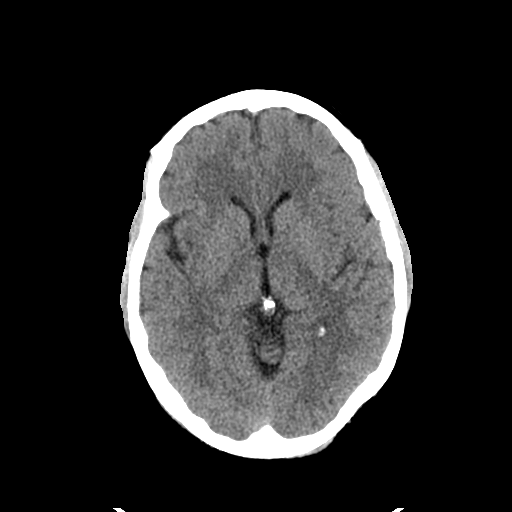
[im 14/31  bone]
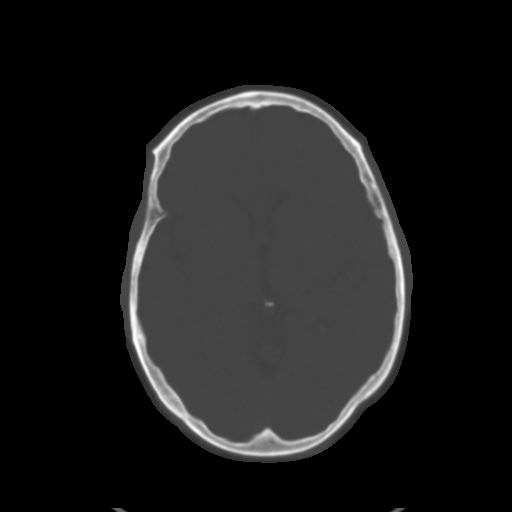
[im 17/31  brain]
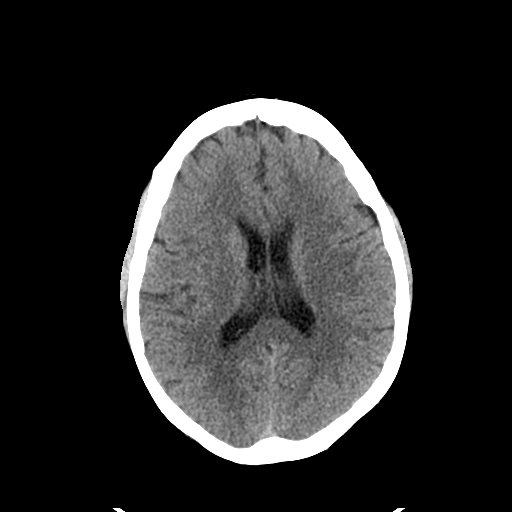
[im 20/31  brain]
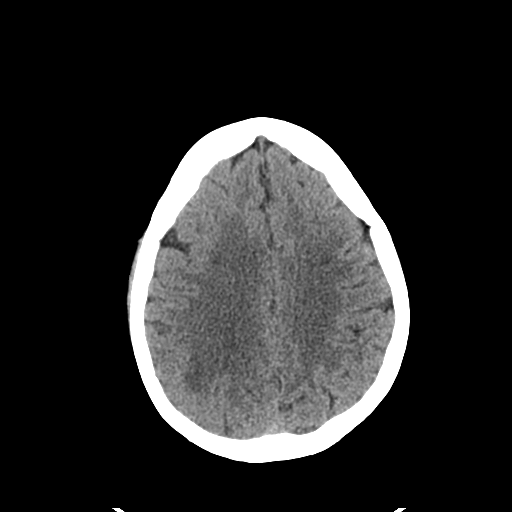
[im 23/31  brain]
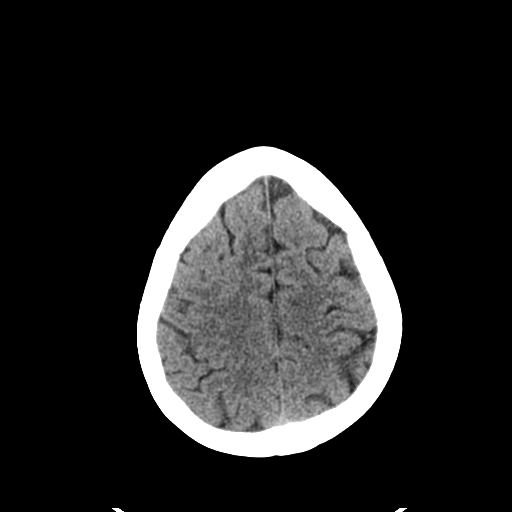
[im 25/31  brain]
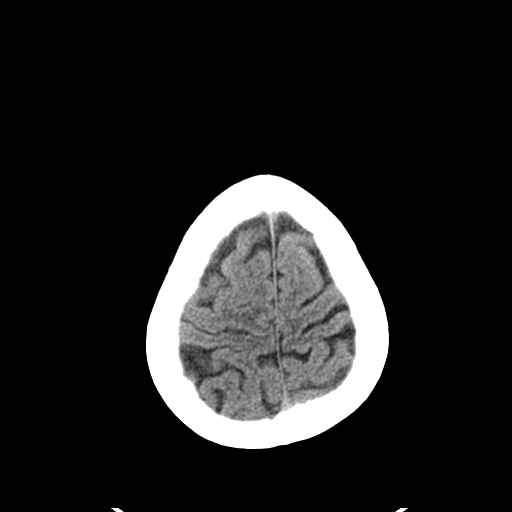
[im 25/31  bone]
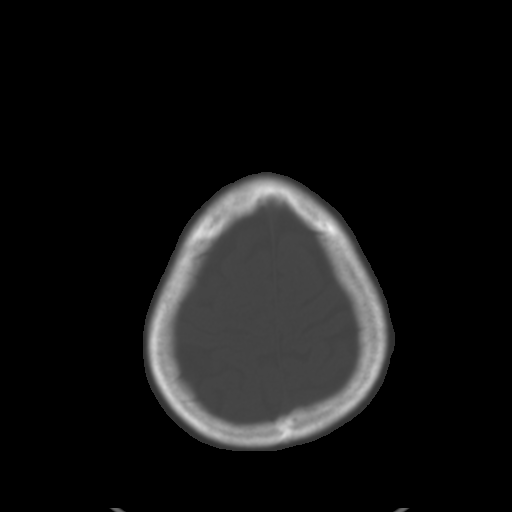
[im 28/31  brain]
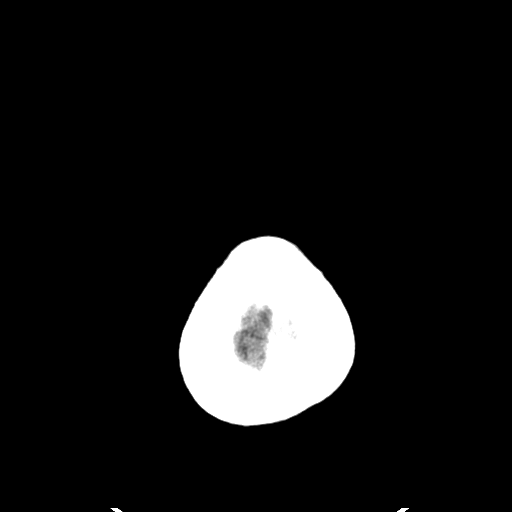

[Series 4: coronal soft tissue · coronal · 0.28mm/px · 3 of 61 slices shown]
[im 21/61  brain]
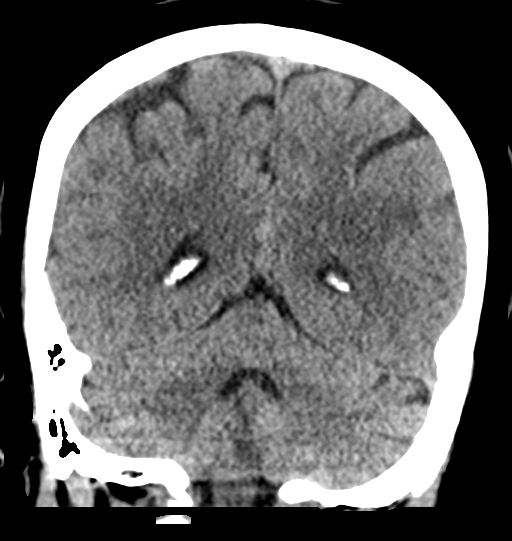
[im 27/61  brain]
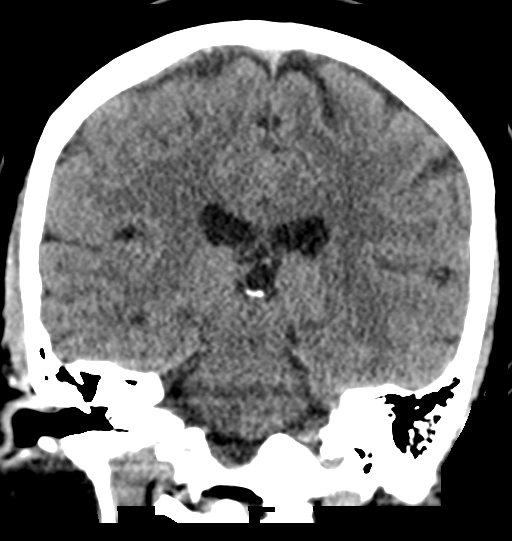
[im 34/61  brain]
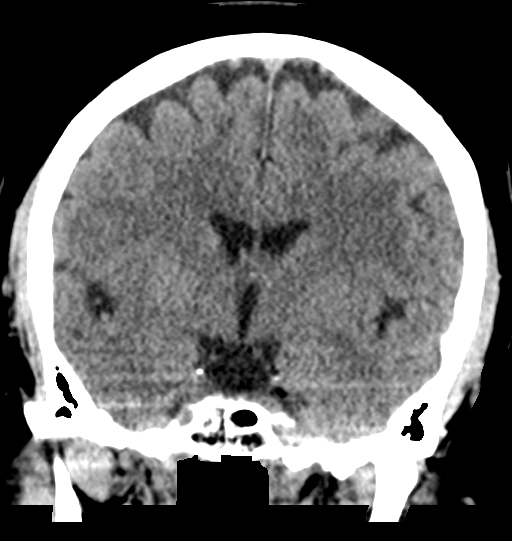

[Series 5: sagittal soft tissue · sagittal · 0.29mm/px · 3 of 48 slices shown]
[im 16/48  brain]
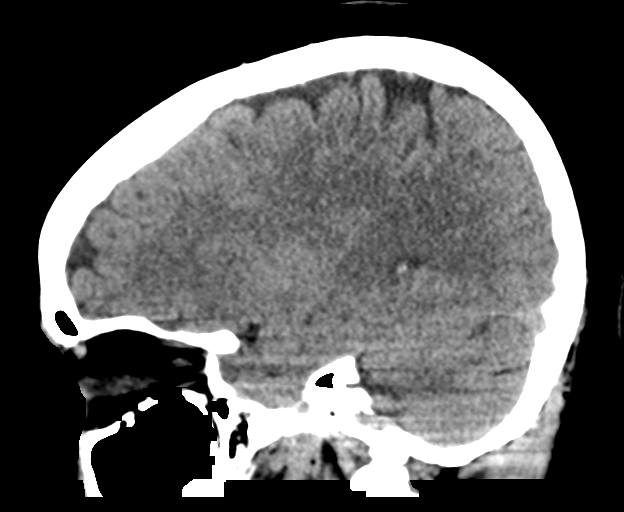
[im 24/48  brain]
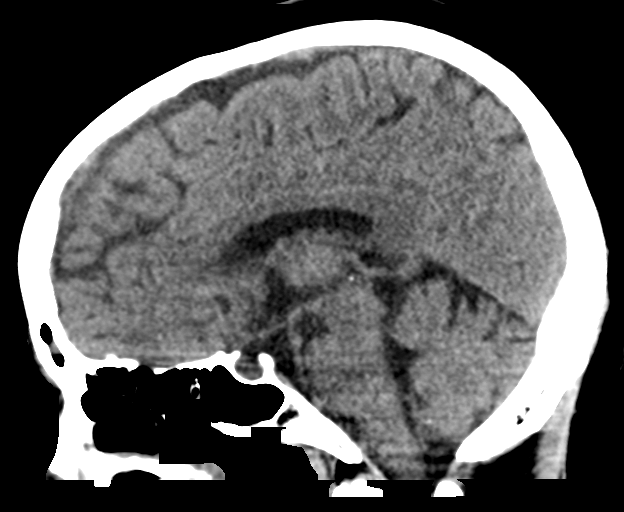
[im 32/48  brain]
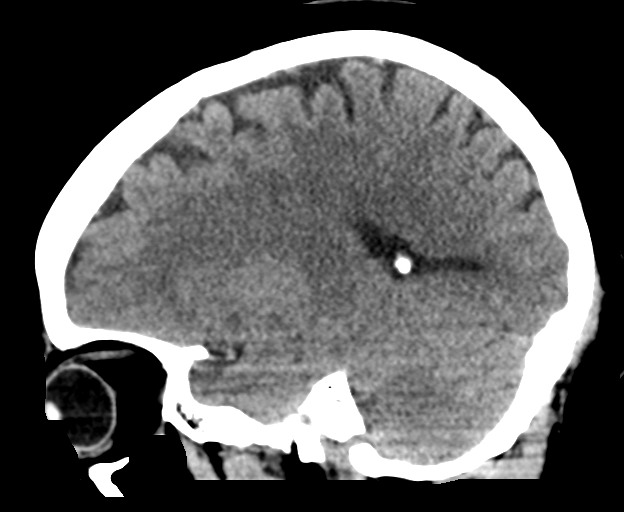

[16 of 47 positions shown; findings below may reference images not displayed]

FINDINGS: Brain: Normal ventricular morphology. No midline shift or mass
effect. Normal appearance of brain parenchyma. No intracranial
hemorrhage, mass lesion, or evidence of acute infarction. No
extra-axial fluid collections.

Vascular: No hyperdense vessels

Skull: Normal appearance

Sinuses/Orbits: Clear

Other: N/A
IMPRESSION: Normal exam.

## 2022-03-15 ENCOUNTER — Telehealth (HOSPITAL_COMMUNITY): Payer: Self-pay

## 2022-03-15 NOTE — Telephone Encounter (Signed)
Called and spoke with pt in regards to CR, pt stated she is interested at this time due to her work schedule. She is planning to retire in April and she will contact us at that time.   Closed referral

## 2022-03-18 DIAGNOSIS — I214 Non-ST elevation (NSTEMI) myocardial infarction: Secondary | ICD-10-CM | POA: Insufficient documentation

## 2022-03-18 DIAGNOSIS — F418 Other specified anxiety disorders: Secondary | ICD-10-CM | POA: Insufficient documentation

## 2022-03-19 DIAGNOSIS — I10 Essential (primary) hypertension: Secondary | ICD-10-CM | POA: Diagnosis not present

## 2022-03-19 DIAGNOSIS — I214 Non-ST elevation (NSTEMI) myocardial infarction: Secondary | ICD-10-CM | POA: Diagnosis not present

## 2022-03-19 DIAGNOSIS — F418 Other specified anxiety disorders: Secondary | ICD-10-CM | POA: Diagnosis not present

## 2022-03-26 ENCOUNTER — Encounter: Payer: Self-pay | Admitting: Cardiology

## 2022-03-26 ENCOUNTER — Ambulatory Visit: Payer: Medicaid Other | Attending: Cardiology | Admitting: Cardiology

## 2022-03-26 VITALS — BP 124/76 | HR 76 | Ht 63.5 in | Wt 132.0 lb

## 2022-03-26 DIAGNOSIS — I10 Essential (primary) hypertension: Secondary | ICD-10-CM

## 2022-03-26 DIAGNOSIS — I214 Non-ST elevation (NSTEMI) myocardial infarction: Secondary | ICD-10-CM | POA: Diagnosis not present

## 2022-03-26 DIAGNOSIS — I2510 Atherosclerotic heart disease of native coronary artery without angina pectoris: Secondary | ICD-10-CM

## 2022-03-26 DIAGNOSIS — E782 Mixed hyperlipidemia: Secondary | ICD-10-CM | POA: Diagnosis not present

## 2022-03-26 NOTE — Progress Notes (Signed)
Cardiology Office Note:    Date:  03/26/2022   ID:  SEAN DEFREECE, DOB 11-Aug-1960, MRN [id number]  PCP:  Joycelyn Man, FNP  Cardiologist:  Berniece Salines, DO  Electrophysiologist:  None   Referring MD: Joycelyn Man, FNP   " I am ok  History of Present Illness:    Haley Turner is a 62 y.o. female with a hx of acute myopericarditis, CAD status cardiac catheterization now on dual antiplatelet therapy, depression here today to be evaluated.  She was recently admitted to [facility] where she presented for chest discomfort and was transferred to [facility] for heart catheterization.  She underwent catheterization which showed severe circumflex lesion into the left main but very poor location for stent intervention therefore 2 antiplatelet therapy was recommended.  She is doing well.  She offers no complaints at this time.     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   debrided, left foot, sub 2nd MT head:  in [place], [place] clinic:  [facility]   Suicide ideation     Past Surgical History:  Procedure Laterality Date   CARDIAC CATHETERIZATION     LEFT HEART CATH AND CORONARY ANGIOGRAPHY N/A 03/06/2022   Procedure: LEFT HEART CATH AND CORONARY ANGIOGRAPHY;  Surgeon: Jettie Booze, MD;  Location: [place] [facility];  Service: Cardiovascular;  Laterality: N/A;   LEFT HEART CATHETERIZATION WITH CORONARY ANGIOGRAM N/A 03/12/2011   Procedure: LEFT HEART CATHETERIZATION WITH CORONARY ANGIOGRAM;  Surgeon: Jettie Booze, MD;  Location: [facility] CATH LAB;  Service: Cardiovascular;  Laterality: N/A;   NO PAST SURGERIES      Current Medications: Current Meds  Medication Sig   acetaminophen (TYLENOL) 325 MG tablet Take 650 mg by mouth every 6 (six) hours as needed for moderate pain.   amLODipine (NORVASC) 5 MG tablet Take 5 mg by mouth daily.   aspirin EC 81 MG tablet Take  1 tablet (81 mg total) by mouth daily.   atorvastatin (LIPITOR) 40 MG tablet Take 2 tablets (80 mg total) by mouth daily. Follow up with cardiology for repeat labs.   clopidogrel (PLAVIX) 75 MG tablet Take 1 tablet (75 mg total) by mouth daily with breakfast.   nicotine (NICODERM CQ - DOSED IN MG/24 HOURS) 21 mg/24hr patch Place 1 patch (21 mg total) onto the skin daily.   nicotine polacrilex (NICORETTE) 2 MG gum Take 1 each (2 mg total) by mouth as needed for smoking cessation.     Allergies:   Sulfonamide derivatives   Social History   Socioeconomic History   Marital status: Single    Spouse name: Not on file   Number of children: Not on file   Years of education: Not on file   Highest education level: Not on file  Occupational History   Not on file  Tobacco Use   Smoking status: Every Day    Packs/day: 0.50    Types: Cigarettes    Last attempt to quit: 01/29/2012    Years since quitting: 10.1   Smokeless tobacco: Never  Substance and Sexual Activity   Alcohol use: Yes    Alcohol/week: 1.0 standard drink of alcohol    Types: 1 Shots of liquor per week    Comment: Daily   Drug use: No   Sexual activity: Yes    Birth control/protection: Condom  Other Topics Concern  Not on file  Social History Narrative   Not on file   Social Determinants of Health   Financial Resource Strain: Not on file  Food Insecurity: No Food Insecurity (03/07/2022)   Hunger Vital Sign    Worried About Running Out of Food in the Last Year: Never true    Ran Out of Food in the Last Year: Never true  Transportation Needs: No Transportation Needs (03/07/2022)   PRAPARE - Hydrologist (Medical): No    Lack of Transportation (Non-Medical): No  Physical Activity: Not on file  Stress: Not on file  Social Connections: Not on file     Family History: The patient's family history includes Arthritis in her mother; Breast cancer in her sister; Hypertension in her brother,  father, mother, and sister; Prostate cancer in her brother; Thyroid disease in her brother.  ROS:   Review of Systems  Constitution: Negative for decreased appetite, fever and weight gain.  HENT: Negative for congestion, ear discharge, hoarse voice and sore throat.   Eyes: Negative for discharge, redness, vision loss in right eye and visual halos.  Cardiovascular: Negative for chest pain, dyspnea on exertion, leg swelling, orthopnea and palpitations.  Respiratory: Negative for cough, hemoptysis, shortness of breath and snoring.   Endocrine: Negative for heat intolerance and polyphagia.  Hematologic/Lymphatic: Negative for bleeding problem. Does not bruise/bleed easily.  Skin: Negative for flushing, nail changes, rash and suspicious lesions.  Musculoskeletal: Negative for arthritis, joint pain, muscle cramps, myalgias, neck pain and stiffness.  Gastrointestinal: Negative for abdominal pain, bowel incontinence, diarrhea and excessive appetite.  Genitourinary: Negative for decreased libido, genital sores and incomplete emptying.  Neurological: Negative for brief paralysis, focal weakness, headaches and loss of balance.  Psychiatric/Behavioral: Negative for altered mental status, depression and suicidal ideas.  Allergic/Immunologic: Negative for HIV exposure and persistent infections.    EKGs/Labs/Other Studies Reviewed:    The following studies were reviewed today:   EKG: None today  TTE 03/06/2021 iMPRESSIONS   1. Left ventricular ejection fraction by 3D volume is 58 %. The left  ventricle has normal function. The left ventricle has no regional wall  motion abnormalities. Left ventricular diastolic parameters were normal.  The average left ventricular global  longitudinal strain is -19.3 %. The global longitudinal strain is normal.   2. Right ventricular systolic function is normal. The right ventricular  size is normal. There is normal pulmonary artery systolic pressure.   3. The  mitral valve is normal in structure. No evidence of mitral valve  regurgitation. No evidence of mitral stenosis.   4. The aortic valve is normal in structure. Aortic valve regurgitation is  not visualized. No aortic stenosis is present.   5. The inferior vena cava is normal in size with greater than 50%  respiratory variability, suggesting right atrial pressure of 3 mmHg.   FINDINGS   Left Ventricle: Left ventricular ejection fraction by 3D volume is 58 %.  The left ventricle has normal function. The left ventricle has no regional wall motion abnormalities. The average left ventricular global longitudinal strain is -19.3 %. The global   longitudinal strain is normal. The left ventricular internal cavity size was normal in size. There is no left ventricular hypertrophy. Left ventricular diastolic parameters were normal.   Right Ventricle: The right ventricular size is normal. No increase in right ventricular wall thickness. Right ventricular systolic function is normal. There is normal pulmonary artery systolic pressure. The tricuspid regurgitant velocity is 2.20 m/s,  and  with an assumed right atrial pressure of 3 mmHg, the estimated right ventricular systolic pressure is [id number] mmHg.   Left Atrium: Left atrial size was normal in size.   Right Atrium: Right atrial size was normal in size.   Pericardium: There is no evidence of pericardial effusion.   Mitral Valve: The mitral valve is normal in structure. No evidence of mitral valve regurgitation. No evidence of mitral valve stenosis.   Tricuspid Valve: The tricuspid valve is normal in structure. Tricuspid valve regurgitation is mild . No evidence of tricuspid stenosis.   Aortic Valve: The aortic valve is normal in structure. Aortic valve regurgitation is not visualized. No aortic stenosis is present.   Pulmonic Valve: The pulmonic valve was normal in structure. Pulmonic valve regurgitation is not visualized. No evidence of pulmonic stenosis.    Aorta: The aortic root is normal in size and structure.   Venous: The inferior vena cava is normal in size with greater than 50%  respiratory variability, suggesting right atrial pressure of 3 mmHg.   IAS/Shunts: No atrial level shunt detected by color flow Doppler.    LHC 03/06/2021   Prox Cx lesion is 25% stenosed.   Ost Cx lesion is 90% stenosed.   Ost LAD to Mid LAD lesion is 25% stenosed.   Mid LAD lesion is 25% stenosed.   Dist LM lesion is 25% stenosed.   The left ventricular systolic function is normal.   LV end diastolic pressure is normal.   The left ventricular ejection fraction is 55-65% by visual estimate.   There is no aortic valve stenosis.  Severe disease in the ostial circumflex.  Appears to be ulcerated plaque in the ostial circumflex that also extends into the distal left main.  The disruption is not flow-limiting in the left main or LAD.  Due to the location of the stenosis, it is not in a good place for a stent.  A stent would have to extend from the mid left main into the mid circumflex and jailed the LAD.  Given the anatomy, will treat with medication.  300 mg Plavix started here.  Continue dual antiplatelet therapy.  Hopefully, DAPT along with high-dose statin therapy will help the ulceration heal.  Add antianginal therapy as needed as well.    Recent Labs: 03/05/2022: ALT 15; B Natriuretic Peptide 26.7 03/07/2022: BUN 11; Creatinine, Ser 0.64; Hemoglobin 12.9; Magnesium 2.2; Platelets 228; Potassium 3.6; Sodium 137  Recent Lipid Panel    Component Value Date/Time   CHOL 175 03/06/2022 0105   CHOL 193 12/03/2017 0924   TRIG 64 03/06/2022 0105   HDL 43 03/06/2022 0105   HDL 59 12/03/2017 0924   CHOLHDL 4.1 03/06/2022 0105   VLDL 13 03/06/2022 0105   LDLCALC 119 (H) 03/06/2022 0105   LDLCALC 120 (H) 12/03/2017 0924    Physical Exam:    VS:  BP 124/76   Pulse 76   Ht 5' 3.5" (1.613 m)   Wt 59.9 kg   SpO2 99%   BMI 23.02 kg/m     Wt Readings from Last  3 Encounters:  03/26/22 59.9 kg  03/06/22 60.3 kg  03/04/20 62.1 kg     GEN: Well nourished, well developed in no acute distress HEENT: Normal NECK: No JVD; No carotid bruits LYMPHATICS: No lymphadenopathy CARDIAC: S1S2 noted,RRR, no murmurs, rubs, gallops RESPIRATORY:  Clear to auscultation without rales, wheezing or rhonchi  ABDOMEN: Soft, non-tender, non-distended, +bowel sounds, no guarding. EXTREMITIES: No edema, No cyanosis,  no clubbing MUSCULOSKELETAL:  No deformity  SKIN: Warm and dry NEUROLOGIC:  Alert and oriented x 3, non-focal PSYCHIATRIC:  Normal affect, good insight  ASSESSMENT:    1. Coronary artery disease involving native coronary artery of native heart without angina pectoris   2. NSTEMI (non-ST elevation myocardial infarction) ([place])   3. Essential hypertension   4. Mixed hyperlipidemia    PLAN:    Clinically she appears to be doing well from a cardiovascular standpoint.  She has done well on her current medical regimen.  No anginal symptoms.  We will continue this regimen.  Will continue to monitor the patient closely.  She has stopped smoking she tells me.    Hyperlipidemia - continue with current statin medication.  The patient is in agreement with the above plan. The patient left the office in stable condition.  The patient will follow up in 6 months or sooner if needed.   Medication Adjustments/Labs and Tests Ordered: Current medicines are reviewed at length with the patient today.  Concerns regarding medicines are outlined above.  No orders of the defined types were placed in this encounter.  No orders of the defined types were placed in this encounter.   Patient Instructions  Medication Instructions:  Your physician recommends that you continue on your current medications as directed. Please refer to the Current Medication list given to you today.  *If you need a refill on your cardiac medications before your next appointment, please call your  pharmacy*   Lab Work: None   Testing/Procedures: None   Follow-Up: At [facility], you and your health needs are our priority.  As part of our continuing mission to provide you with exceptional heart care, we have created designated Provider Care Teams.  These Care Teams include your primary Cardiologist (physician) and Advanced Practice Providers (APPs -  Physician Assistants and Nurse Practitioners) who all work together to provide you with the care you need, when you need it.  Your next appointment:   6 month(s)  Provider:   Berniece Salines, DO      Adopting a Healthy Lifestyle.  Know what a healthy weight is for you (roughly BMI <25) and aim to maintain this   Aim for 7+ servings of fruits and vegetables daily   65-80+ fluid ounces of water or unsweet tea for healthy kidneys   Limit to max 1 drink of alcohol per day; avoid smoking/tobacco   Limit animal fats in diet for cholesterol and heart health - choose grass fed whenever available   Avoid highly processed foods, and foods high in saturated/trans fats   Aim for low stress - take time to unwind and care for your mental health   Aim for 150 min of moderate intensity exercise weekly for heart health, and weights twice weekly for bone health   Aim for 7-9 hours of sleep daily   When it comes to diets, agreement about the perfect plan isnt easy to find, even among the experts. Experts at the [employer] developed an idea known as the Healthy Eating Plate. Just imagine a plate divided into logical, healthy portions.   The emphasis is on diet quality:   Load up on vegetables and fruits - one-half of your plate: Aim for color and variety, and remember that potatoes dont count.   Go for whole grains - one-quarter of your plate: Whole wheat, barley, wheat berries, quinoa, oats, brown rice, and foods made with them. If you want pasta,  go with whole wheat pasta.   Protein power - one-quarter of  your plate: Fish, chicken, beans, and nuts are all healthy, versatile protein sources. Limit red meat.   The diet, however, does go beyond the plate, offering a few other suggestions.   Use healthy plant oils, such as olive, canola, soy, corn, sunflower and peanut. Check the labels, and avoid partially hydrogenated oil, which have unhealthy trans fats.   If youre thirsty, drink water. Coffee and tea are good in moderation, but skip sugary drinks and limit milk and dairy products to one or two daily servings.   The type of carbohydrate in the diet is more important than the amount. Some sources of carbohydrates, such as vegetables, fruits, whole grains, and beans-are healthier than others.   Finally, stay active  Rolly Pancake, DO  03/26/2022 3:49 PM    [facility]

## 2022-03-26 NOTE — Patient Instructions (Signed)
Medication Instructions:  Your physician recommends that you continue on your current medications as directed. Please refer to the Current Medication list given to you today.  *If you need a refill on your cardiac medications before your next appointment, please call your pharmacy*   Lab Work: None   Testing/Procedures: None   Follow-Up: At [facility], you and your health needs are our priority.  As part of our continuing mission to provide you with exceptional heart care, we have created designated Provider Care Teams.  These Care Teams include your primary Cardiologist (physician) and Advanced Practice Providers (APPs -  Physician Assistants and Nurse Practitioners) who all work together to provide you with the care you need, when you need it.  Your next appointment:   6 month(s)  Provider:   Berniece Salines, DO

## 2022-03-29 ENCOUNTER — Encounter: Payer: Self-pay | Admitting: Family Medicine

## 2022-03-29 ENCOUNTER — Other Ambulatory Visit: Payer: Self-pay | Admitting: Family Medicine

## 2022-03-29 DIAGNOSIS — F411 Generalized anxiety disorder: Secondary | ICD-10-CM | POA: Diagnosis not present

## 2022-03-29 DIAGNOSIS — Z13820 Encounter for screening for osteoporosis: Secondary | ICD-10-CM

## 2022-03-30 ENCOUNTER — Other Ambulatory Visit: Payer: Medicaid Other

## 2022-04-09 ENCOUNTER — Other Ambulatory Visit (HOSPITAL_COMMUNITY): Payer: Self-pay

## 2022-04-16 ENCOUNTER — Ambulatory Visit: Payer: Medicaid Other | Admitting: Podiatry

## 2022-04-20 ENCOUNTER — Ambulatory Visit
Admission: RE | Admit: 2022-04-20 | Discharge: 2022-04-20 | Disposition: A | Discharge status: Home / Self Care | Payer: Medicaid Other | Source: Ambulatory Visit | Attending: Family Medicine | Admitting: Family Medicine

## 2022-04-20 DIAGNOSIS — R252 Cramp and spasm: Secondary | ICD-10-CM

## 2022-04-20 DIAGNOSIS — M79662 Pain in left lower leg: Secondary | ICD-10-CM | POA: Diagnosis not present

## 2022-04-20 DIAGNOSIS — M79661 Pain in right lower leg: Secondary | ICD-10-CM | POA: Diagnosis not present

## 2022-04-26 DIAGNOSIS — F411 Generalized anxiety disorder: Secondary | ICD-10-CM | POA: Diagnosis not present

## 2022-04-29 DIAGNOSIS — I252 Old myocardial infarction: Secondary | ICD-10-CM | POA: Insufficient documentation

## 2022-04-30 DIAGNOSIS — L84 Corns and callosities: Secondary | ICD-10-CM | POA: Diagnosis not present

## 2022-04-30 DIAGNOSIS — I10 Essential (primary) hypertension: Secondary | ICD-10-CM | POA: Diagnosis not present

## 2022-04-30 DIAGNOSIS — I252 Old myocardial infarction: Secondary | ICD-10-CM | POA: Diagnosis not present

## 2022-06-11 DIAGNOSIS — I252 Old myocardial infarction: Secondary | ICD-10-CM | POA: Diagnosis not present

## 2022-06-11 DIAGNOSIS — L84 Corns and callosities: Secondary | ICD-10-CM | POA: Diagnosis not present

## 2022-06-11 DIAGNOSIS — Z720 Tobacco use: Secondary | ICD-10-CM | POA: Diagnosis not present

## 2022-06-11 DIAGNOSIS — E785 Hyperlipidemia, unspecified: Secondary | ICD-10-CM | POA: Diagnosis not present

## 2022-06-11 DIAGNOSIS — I10 Essential (primary) hypertension: Secondary | ICD-10-CM | POA: Diagnosis not present

## 2022-06-22 DIAGNOSIS — M792 Neuralgia and neuritis, unspecified: Secondary | ICD-10-CM | POA: Diagnosis not present

## 2022-06-22 DIAGNOSIS — M2041 Other hammer toe(s) (acquired), right foot: Secondary | ICD-10-CM | POA: Diagnosis not present

## 2022-06-22 DIAGNOSIS — M2012 Hallux valgus (acquired), left foot: Secondary | ICD-10-CM | POA: Diagnosis not present

## 2022-06-22 DIAGNOSIS — S99922A Unspecified injury of left foot, initial encounter: Secondary | ICD-10-CM | POA: Diagnosis not present

## 2022-06-22 DIAGNOSIS — M21962 Unspecified acquired deformity of left lower leg: Secondary | ICD-10-CM | POA: Diagnosis not present

## 2022-06-22 DIAGNOSIS — D492 Neoplasm of unspecified behavior of bone, soft tissue, and skin: Secondary | ICD-10-CM | POA: Diagnosis not present

## 2022-06-22 DIAGNOSIS — Q828 Other specified congenital malformations of skin: Secondary | ICD-10-CM | POA: Diagnosis not present

## 2022-06-22 DIAGNOSIS — M2042 Other hammer toe(s) (acquired), left foot: Secondary | ICD-10-CM | POA: Diagnosis not present

## 2022-07-09 DIAGNOSIS — F411 Generalized anxiety disorder: Secondary | ICD-10-CM | POA: Diagnosis not present

## 2022-07-09 DIAGNOSIS — M5431 Sciatica, right side: Secondary | ICD-10-CM | POA: Diagnosis not present

## 2022-07-09 DIAGNOSIS — L84 Corns and callosities: Secondary | ICD-10-CM | POA: Diagnosis not present

## 2022-07-09 DIAGNOSIS — R42 Dizziness and giddiness: Secondary | ICD-10-CM | POA: Diagnosis not present

## 2022-07-09 DIAGNOSIS — Z720 Tobacco use: Secondary | ICD-10-CM | POA: Diagnosis not present

## 2022-07-13 DIAGNOSIS — M21962 Unspecified acquired deformity of left lower leg: Secondary | ICD-10-CM | POA: Diagnosis not present

## 2022-07-13 DIAGNOSIS — M2041 Other hammer toe(s) (acquired), right foot: Secondary | ICD-10-CM | POA: Diagnosis not present

## 2022-07-13 DIAGNOSIS — M2012 Hallux valgus (acquired), left foot: Secondary | ICD-10-CM | POA: Diagnosis not present

## 2022-07-13 DIAGNOSIS — M2042 Other hammer toe(s) (acquired), left foot: Secondary | ICD-10-CM | POA: Diagnosis not present

## 2022-07-13 DIAGNOSIS — M792 Neuralgia and neuritis, unspecified: Secondary | ICD-10-CM | POA: Diagnosis not present

## 2022-07-16 ENCOUNTER — Telehealth: Payer: Self-pay | Admitting: Cardiology

## 2022-07-16 NOTE — Telephone Encounter (Signed)
Patient states she has been experiencing a dull pain that comes and goes underneath her arms and towards her side for the past 2 weeks.

## 2022-07-16 NOTE — Telephone Encounter (Signed)
Spoke to patient she stated she has been having chest pain across chest radiating under both arms off and on for the past 2 weeks.Each episode last appox 5 min or less.No chest pain at present.Advised Dr.Tobb is out of office this week.Appointment scheduled with Joni Reining DNP 6/21 at 2:45 pm.Advised if pain worsens she will need to go to ED.I will make Dr.Tobb aware.

## 2022-07-17 NOTE — Progress Notes (Deleted)
Cardiology Clinic Note   Patient Name: Haley Turner Date of Encounter: 07/17/2022  Primary Care Provider:  Dot Been, FNP Primary Cardiologist:  Haley Ripple, DO  Patient Profile    62 year old female with history of acute myopericarditis, CAD status cardiac catheterization which showed severe circumflex lesion into the left main but very poor location for stent intervention therefore 2 antiplatelet therapy was recommended. now on dual antiplatelet therapy, depression.   Past Medical History    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   debrided, left foot, sub 2nd MT head:  in [place], [place] clinic:  [facility]   Suicide ideation    Past Surgical History:  Procedure Laterality Date   CARDIAC CATHETERIZATION     LEFT HEART CATH AND CORONARY ANGIOGRAPHY N/A 03/06/2022   Procedure: LEFT HEART CATH AND CORONARY ANGIOGRAPHY;  Surgeon: Corky Crafts, MD;  Location: [facility] INVASIVE CV LAB;  Service: Cardiovascular;  Laterality: N/A;   LEFT HEART CATHETERIZATION WITH CORONARY ANGIOGRAM N/A 03/12/2011   Procedure: LEFT HEART CATHETERIZATION WITH CORONARY ANGIOGRAM;  Surgeon: Corky Crafts, MD;  Location: [facility] CATH LAB;  Service: Cardiovascular;  Laterality: N/A;   NO PAST SURGERIES      Allergies  Allergies  Allergen Reactions   Sulfonamide Derivatives     REACTION: Pruritic rash    History of Present Illness    Haley Turner called our office on 07/16/2022 for complaints of chest pain, radiating to both arms on an off for 2 weeks lasing approximately 5 minutes or less. She has h/o CAD with severe L Cx disease lesion into the left main, with very poor location for stent intervention.    Home Medications    Current Outpatient Medications  Medication Sig Dispense Refill   acetaminophen (TYLENOL) 325 MG tablet Take 650 mg by mouth every 6 (six) hours as needed for moderate  pain.     amLODipine (NORVASC) 5 MG tablet Take 5 mg by mouth daily.     aspirin EC 81 MG tablet Take 1 tablet (81 mg total) by mouth daily. 30 tablet 11   atorvastatin (LIPITOR) 40 MG tablet Take 2 tablets (80 mg total) by mouth daily. Follow up with cardiology for repeat labs. 60 tablet 1   nicotine (NICODERM CQ - DOSED IN MG/24 HOURS) 21 mg/24hr patch Place 1 patch (21 mg total) onto the skin daily. 28 patch 0   nicotine polacrilex (NICORETTE) 2 MG gum Take 1 each (2 mg total) by mouth as needed for smoking cessation. 100 tablet 0   No current facility-administered medications for this visit.     Family History    Family History  Problem Relation Age of Onset   Arthritis Mother    Hypertension Mother    Hypertension Father    Breast cancer Sister    Hypertension Sister    Hypertension Brother    Prostate cancer Brother    Thyroid disease Brother    She indicated that the status of her mother is unknown. She indicated that the status of her father is unknown. She indicated that the status of her sister is unknown. She indicated that the status of her brother is unknown.  Social History    Social History   Socioeconomic History   Marital status: Single    Spouse name: Not on file   Number of children: Not on file  Years of education: Not on file   Highest education level: Not on file  Occupational History   Not on file  Tobacco Use   Smoking status: Every Day    Packs/day: .5    Types: Cigarettes    Last attempt to quit: 01/29/2012    Years since quitting: 10.4   Smokeless tobacco: Never  Substance and Sexual Activity   Alcohol use: Yes    Alcohol/week: 1.0 standard drink of alcohol    Types: 1 Shots of liquor per week    Comment: Daily   Drug use: No   Sexual activity: Yes    Birth control/protection: Condom  Other Topics Concern   Not on file  Social History Narrative   Not on file   Social Determinants of Health   Financial Resource Strain: Not on file   Food Insecurity: No Food Insecurity (03/07/2022)   Hunger Vital Sign    Worried About Running Out of Food in the Last Year: Never true    Ran Out of Food in the Last Year: Never true  Transportation Needs: No Transportation Needs (03/07/2022)   PRAPARE - Administrator, Civil Service (Medical): No    Lack of Transportation (Non-Medical): No  Physical Activity: Not on file  Stress: Not on file  Social Connections: Not on file  Intimate Partner Violence: Not At Risk (03/07/2022)   Humiliation, Afraid, Rape, and Kick questionnaire    Fear of Current or Ex-Partner: No    Emotionally Abused: No    Physically Abused: No    Sexually Abused: No     Review of Systems    General:  No chills, fever, night sweats or weight changes.  Cardiovascular:  No chest pain, dyspnea on exertion, edema, orthopnea, palpitations, paroxysmal nocturnal dyspnea. Dermatological: No rash, lesions/masses Respiratory: No cough, dyspnea Urologic: No hematuria, dysuria Abdominal:   No nausea, vomiting, diarrhea, bright red blood per rectum, melena, or hematemesis Neurologic:  No visual changes, wkns, changes in mental status. All other systems reviewed and are otherwise negative except as noted above.     Physical Exam    VS:  There were no vitals taken for this visit. , BMI There is no height or weight on file to calculate BMI.     GEN: Well nourished, well developed, in no acute distress. HEENT: normal. Neck: Supple, no JVD, carotid bruits, or masses. Cardiac: RRR, no murmurs, rubs, or gallops. No clubbing, cyanosis, edema.  Radials/DP/PT 2+ and equal bilaterally.  Respiratory:  Respirations regular and unlabored, clear to auscultation bilaterally. GI: Soft, nontender, nondistended, BS + x 4. MS: no deformity or atrophy. Skin: warm and dry, no rash. Neuro:  Strength and sensation are intact. Psych: Normal affect.  Accessory Clinical Findings      Lab Results  Component Value Date   WBC 5.0  03/07/2022   HGB 12.9 03/07/2022   HCT 39.3 03/07/2022   MCV 87.3 03/07/2022   PLT 228 03/07/2022   Lab Results  Component Value Date   CREATININE 0.64 03/07/2022   BUN 11 03/07/2022   NA 137 03/07/2022   K 3.6 03/07/2022   CL 108 03/07/2022   CO2 20 (L) 03/07/2022   Lab Results  Component Value Date   ALT 15 03/05/2022   AST 24 03/05/2022   ALKPHOS 67 03/05/2022   BILITOT 0.5 03/05/2022   Lab Results  Component Value Date   CHOL 175 03/06/2022   HDL 43 03/06/2022   LDLCALC 119 (H) 03/06/2022  TRIG 64 03/06/2022   CHOLHDL 4.1 03/06/2022    Lab Results  Component Value Date   HGBA1C 5.2 03/06/2022    Review of Prior Studies:  TTE 03/06/2021 iMPRESSIONS   1. Left ventricular ejection fraction by 3D volume is 58 %. The left  ventricle has normal function. The left ventricle has no regional wall  motion abnormalities. Left ventricular diastolic parameters were normal.  The average left ventricular global  longitudinal strain is -19.3 %. The global longitudinal strain is normal.   2. Right ventricular systolic function is normal. The right ventricular  size is normal. There is normal pulmonary artery systolic pressure.   3. The mitral valve is normal in structure. No evidence of mitral valve  regurgitation. No evidence of mitral stenosis.   4. The aortic valve is normal in structure. Aortic valve regurgitation is  not visualized. No aortic stenosis is present.   5. The inferior vena cava is normal in size with greater than 50%  respiratory variability, suggesting right atrial pressure of 3 mmHg.   FINDINGS   Left Ventricle: Left ventricular ejection fraction by 3D volume is 58 %.  The left ventricle has normal function. The left ventricle has no regional wall motion abnormalities. The average left ventricular global longitudinal strain is -19.3 %. The global   longitudinal strain is normal. The left ventricular internal cavity size was normal in size. There is no left  ventricular hypertrophy. Left ventricular diastolic parameters were normal.   Right Ventricle: The right ventricular size is normal. No increase in right ventricular wall thickness. Right ventricular systolic function is normal. There is normal pulmonary artery systolic pressure. The tricuspid regurgitant velocity is 2.20 m/s, and  with an assumed right atrial pressure of 3 mmHg, the estimated right ventricular systolic pressure is 22.4 mmHg.   Left Atrium: Left atrial size was normal in size.   Right Atrium: Right atrial size was normal in size.   Pericardium: There is no evidence of pericardial effusion.   Mitral Valve: The mitral valve is normal in structure. No evidence of mitral valve regurgitation. No evidence of mitral valve stenosis.   Tricuspid Valve: The tricuspid valve is normal in structure. Tricuspid valve regurgitation is mild . No evidence of tricuspid stenosis.   Aortic Valve: The aortic valve is normal in structure. Aortic valve regurgitation is not visualized. No aortic stenosis is present.   Pulmonic Valve: The pulmonic valve was normal in structure. Pulmonic valve regurgitation is not visualized. No evidence of pulmonic stenosis.   Aorta: The aortic root is normal in size and structure.   Venous: The inferior vena cava is normal in size with greater than 50%  respiratory variability, suggesting right atrial pressure of 3 mmHg.   IAS/Shunts: No atrial level shunt detected by color flow Doppler.      LHC 03/06/2021   Prox Cx lesion is 25% stenosed.   Ost Cx lesion is 90% stenosed.   Ost LAD to Mid LAD lesion is 25% stenosed.   Mid LAD lesion is 25% stenosed.   Dist LM lesion is 25% stenosed.   The left ventricular systolic function is normal.   LV end diastolic pressure is normal.   The left ventricular ejection fraction is 55-65% by visual estimate.   There is no aortic valve stenosis.  Severe disease in the ostial circumflex.  Appears to be ulcerated plaque in  the ostial circumflex that also extends into the distal left main.  The disruption is not flow-limiting in  the left main or LAD.  Due to the location of the stenosis, it is not in a good place for a stent.  A stent would have to extend from the mid left main into the mid circumflex and jailed the LAD.  Given the anatomy, will treat with medication.  300 mg Plavix started here.  Continue dual antiplatelet therapy.  Hopefully, DAPT along with high-dose statin therapy will help the ulceration heal.  Add antianginal therapy as needed as well.  Diagnostic Dominance: Right     Assessment & Plan   1.  ***     {Are you ordering a CV Procedure (e.g. stress test, cath, DCCV, TEE, etc)?   Press F2        :[id number]}   Signed, Bettey Mare. Liborio Nixon, ANP, AACC   07/17/2022 7:35 AM      Office [phone] Fax [phone]  Notice: This dictation was prepared with Dragon dictation along with smaller phrase technology. Any transcriptional errors that result from this process are unintentional and may not be corrected upon review.

## 2022-07-20 ENCOUNTER — Ambulatory Visit: Payer: Medicaid Other | Admitting: Adult Health

## 2022-07-20 DIAGNOSIS — R059 Cough, unspecified: Secondary | ICD-10-CM | POA: Diagnosis not present

## 2022-07-20 DIAGNOSIS — U071 COVID-19: Secondary | ICD-10-CM | POA: Diagnosis not present

## 2022-07-23 ENCOUNTER — Ambulatory Visit: Payer: Medicaid Other | Admitting: Student

## 2022-08-19 ENCOUNTER — Ambulatory Visit (HOSPITAL_COMMUNITY)
Admission: EM | Admit: 2022-08-19 | Discharge: 2022-08-19 | Disposition: A | Discharge status: Home / Self Care | Payer: Self-pay | Attending: Emergency Medicine | Admitting: Emergency Medicine

## 2022-08-19 ENCOUNTER — Ambulatory Visit (INDEPENDENT_AMBULATORY_CARE_PROVIDER_SITE_OTHER): Payer: Self-pay

## 2022-08-19 ENCOUNTER — Encounter (HOSPITAL_COMMUNITY): Payer: Self-pay

## 2022-08-19 DIAGNOSIS — M25552 Pain in left hip: Secondary | ICD-10-CM

## 2022-08-19 DIAGNOSIS — M25512 Pain in left shoulder: Secondary | ICD-10-CM

## 2022-08-19 DIAGNOSIS — M25561 Pain in right knee: Secondary | ICD-10-CM

## 2022-08-19 DIAGNOSIS — W19XXXA Unspecified fall, initial encounter: Secondary | ICD-10-CM

## 2022-08-19 MED ORDER — METHOCARBAMOL 500 MG PO TABS: 500.0000 mg | ORAL_TABLET | Freq: Two times a day (BID) | ORAL | 0 refills | Status: DC

## 2022-08-19 NOTE — ED Triage Notes (Signed)
Patient here today with c/o injury to her right knee, left shoulder, and left hip after falling on Friday. Patient states that she tripped over a crate. She has taken Tylenol with some relief.

## 2022-08-19 NOTE — Discharge Instructions (Addendum)
Your knee x-ray was negative for any acute fractures or dislocations.  Please rest, ice and elevate your knee.  You can take Tylenol every 6 hours as needed for pain.  For your musculoskeletal hip and shoulder pain please heat, use gentle stretching and rest.  You can take the muscle relaxer up to 2 times daily as needed, do not drink or drive on this medication as it may cause drowsiness.   If your pain persist beyond the next few weeks, please follow-up with your primary care provider, who can consider referring you to an orthopedic for further evaluation.  Return to clinic for any new or urgent symptoms.

## 2022-08-19 NOTE — ED Provider Notes (Signed)
[facility]    CSN: [id number] Arrival date & time: 08/19/22  1233      History   Chief Complaint Chief Complaint  Patient presents with   Fall    HPI Haley Turner is a 62 y.o. female.   Patient presents to clinic for right knee pain, left hip pain and left shoulder pain after a fall on Friday.  She was in a store when she tripped over a water crate that was on the ground.  She twisted her left foot and fell forward onto both knees.  Reports the whole thing happened very quickly she is unsure exactly how she fell.  She does not think she landed on her left hip or left shoulder.  Thinks these areas are sore.  She is ambulatory in clinic, since the injury she has been resting, icing and elevating her knee.  Initially her knee was swollen, swelling is since resolved. She did not hit her head and did not lose consciousness.   She has been using Tylenol and ice.   Today she got up to go to church and noticed how bad her pain was.   The history is provided by the patient and medical records.  Fall    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   debrided, left foot, sub 2nd MT head:  in [place], [place] clinic:  [facility]   Suicide ideation     Patient Active Problem List   Diagnosis Date Noted   NSTEMI (non-ST elevated myocardial infarction) (HCC) 03/06/2022   NSTEMI (non-ST elevation myocardial infarction) (HCC) 03/05/2022   Essential hypertension 03/05/2022   GAD (generalized anxiety disorder) 03/05/2022   Tobacco use 10/30/2017   Pre-ulcerative corn or callous 03/05/2017   Hyperlipidemia 10/25/2009   GONORRHEA 09/23/2009   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   PANIC DISORDER 09/23/2009   Depression 09/23/2009   CARPAL TUNNEL SYNDROME, LEFT 09/23/2009   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 CATH LAB;  Service: Cardiovascular;  Laterality: N/A;   NO PAST SURGERIES      OB History   No obstetric history on file.      Home Medications    Prior to Admission medications   Medication Sig Start Date End Date Taking? Authorizing Provider  acetaminophen (TYLENOL) 325 MG tablet Take 650 mg by mouth every 6 (six) hours as needed for moderate pain.   Yes [provider]  amLODipine (NORVASC) 5 MG tablet Take 5 mg by mouth daily. 02/19/22  Yes [provider]  aspirin EC 81 MG tablet Take 1 tablet (81 mg total) by mouth daily. 03/07/22 03/02/23 Yes Zigmund Daniel., MD  atorvastatin (LIPITOR) 40 MG tablet Take 2 tablets (80 mg total) by mouth daily. Follow up with cardiology for repeat labs. 03/08/22 08/19/22 Yes Zigmund Daniel., MD  methocarbamol (ROBAXIN) 500 MG tablet Take 1 tablet (500  mg total) by mouth 2 (two) times daily. 08/19/22  Yes Rinaldo Ratel, [place], FNP  nicotine (NICODERM CQ - DOSED IN MG/24 HOURS) 21 mg/24hr patch Place 1 patch (21 mg total) onto the skin daily. 03/07/22   Zigmund Daniel., MD    Family History Family History  Problem Relation Age of Onset   Arthritis Mother    Hypertension Mother    Hypertension Father    Breast cancer Sister    Hypertension Sister    Hypertension Brother    Prostate cancer Brother    Thyroid disease Brother     Social History Social History   Tobacco Use   Smoking status: Former    Current packs/day: 0.00    Types: Cigarettes    Quit date:  01/29/2012    Years since quitting: 10.5   Smokeless tobacco: Never  Vaping Use   Vaping status: Never Used  Substance Use Topics   Alcohol use: Yes    Alcohol/week: 1.0 standard drink of alcohol    Types: 1 Shots of liquor per week    Comment: Socially   Drug use: No     Allergies   Sulfonamide derivatives   Review of Systems Review of Systems  Musculoskeletal:  Negative for gait problem and joint swelling.     Physical Exam Triage Vital Signs ED Triage Vitals  Encounter Vitals Group     BP 08/19/22 1313 (!) 146/75     Systolic BP Percentile --      Diastolic BP Percentile --      Pulse Rate 08/19/22 1313 (!) 56     Resp 08/19/22 1313 16     Temp 08/19/22 1313 98.5 F (36.9 C)     Temp Source 08/19/22 1313 Oral     SpO2 08/19/22 1313 97 %     Weight 08/19/22 1313 129 lb (58.5 kg)     Height 08/19/22 1313 5' 3.5" (1.613 m)     Head Circumference --      Peak Flow --      Pain Score 08/19/22 1312 8     Pain Loc --      Pain Education --      Exclude from Growth Chart --    No data found.  Updated Vital Signs BP (!) 146/75 (BP Location: Right Arm)   Pulse (!) 56   Temp 98.5 F (36.9 C) (Oral)   Resp 16   Ht 5' 3.5" (1.613 m)   Wt 129 lb (58.5 kg)   SpO2 97%   BMI 22.49 kg/m   Visual Acuity Right Eye Distance:   Left Eye Distance:   Bilateral Distance:    Right Eye Near:   Left Eye Near:    Bilateral Near:     Physical Exam Vitals and nursing note reviewed.  Constitutional:      Appearance: Normal appearance.  HENT:     Head: Normocephalic and atraumatic.     Right Ear: External ear normal.     Left Ear: External ear normal.     Nose: Nose normal.     Mouth/Throat:     Mouth: Mucous membranes are moist.  Eyes:     Conjunctiva/sclera: Conjunctivae normal.  Cardiovascular:     Rate and Rhythm: Normal rate.     Pulses: Normal pulses.  Pulmonary:     Effort: Pulmonary effort is normal. No respiratory distress.  Musculoskeletal:         General: Tenderness and signs of injury present.  No swelling or deformity. Normal range of motion.     Cervical back: Normal range of motion.     Right knee: Bony tenderness present. No swelling or deformity. Normal range of motion. No tenderness.       Legs:  Skin:    General: Skin is warm and dry.     Capillary Refill: Capillary refill takes less than 2 seconds.  Neurological:     General: No focal deficit present.     Mental Status: She is alert and oriented to person, place, and time.  Psychiatric:        Mood and Affect: Mood normal.        Behavior: Behavior normal. Behavior is cooperative.      UC Treatments / Results  Labs (all labs ordered are listed, but only abnormal results are displayed) Labs Reviewed - No data to display  EKG   Radiology DG Knee Complete 4 Views Right  Result Date: 08/19/2022 CLINICAL DATA:  Knee pain after a fall. EXAM: RIGHT KNEE - COMPLETE 4+ VIEW COMPARISON:  None Available. FINDINGS: No evidence of fracture, dislocation, or joint effusion. 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 Soft tissues are unremarkable. IMPRESSION: No acute osseous injury. Electronically Signed   By: Romona Curls M.D.   On: 08/19/2022 13:53    Procedures Procedures (including critical care time)  Medications Ordered in UC Medications - No data to display  Initial Impression / Assessment and Plan / UC Course  I have reviewed the triage vital signs and the nursing notes.  Pertinent labs & imaging results that were available during my care of the patient were reviewed by me and considered in my medical decision making (see chart for details).  Vitals and triage reviewed, patient is hemodynamically stable.  Patient ambulatory with steady gait post fall 3 days prior.  Knee with bony tenderness anteriorly, no swelling or deformity noted.  Discussed that this is low likelihood of fracture or acute abnormality, will image for reassurance, imaging  negative.  Left hip pain and left shoulder pain appears musculoskeletal in nature, no limitations to range of motion.  Musculoskeletal tenderness to palpation.  Symptomatic management of musculoskeletal pain discussed, orthopedic follow-up if needed.  Plan of care, follow-up care and return precautions given, no questions at this time.     Final Clinical Impressions(s) / UC Diagnoses   Final diagnoses:  Acute pain of right knee  Left hip pain  Acute pain of left shoulder  Fall, initial encounter     Discharge Instructions      Your knee x-ray was negative for any acute fractures or dislocations.  Please rest, ice and elevate your knee.  You can take Tylenol every 6 hours as needed for pain.  For your musculoskeletal hip and shoulder pain please heat, use gentle stretching and rest.  You can take the muscle relaxer up to 2 times daily as needed, do not drink or drive on this medication as it may cause drowsiness.   If your pain persist beyond the next few weeks, please follow-up with your primary care provider, who can consider referring you to an orthopedic for further evaluation.  Return to clinic for any new or urgent symptoms.      ED Prescriptions     Medication Sig Dispense Auth. Provider   methocarbamol (ROBAXIN) 500 MG tablet Take 1 tablet (500 mg total) by mouth 2 (two) times daily. 20 tablet Gardy Montanari, [place], [place]      PDMP not reviewed this  encounter.   Tristen Pennino, [place], [place] 08/19/22 [phone]

## 2022-08-30 ENCOUNTER — Ambulatory Visit: Payer: Self-pay | Admitting: Adult Health

## 2022-09-01 NOTE — Progress Notes (Deleted)
Cardiology Office Note:    Date:  09/01/2022   ID:  Haley Turner, DOB Jul 01, 1960, MRN [id number]  PCP:  Dot Been, FNP  Cardiologist:  Thomasene Ripple, DO  Electrophysiologist:  None   Referring MD: Dot Been, FNP   Chief Complaint: chest pain  History of Present Illness:    Haley Turner is a 62 y.o. female with a history of CAD with 90% stenosis of ostial LCX with ulcerated plaque that extended into the distal left main on cardiac catheterization in 03/2022 (not felt to be a good location for a stent; therefore, treated medically), myopericarditis in 2013, hyperlipidemia, and depression who is followed by Dr. Servando Salina and presents today for further evaluation of chest pain.  Patient was first seen by Dr. Servando Salina during an admission in 03/2022 for NSTEMI. Echo at that time showed LVEF of 58% with normal wall motion and diastolic parameters, normal RV, and no significant valvular disease. LHC showed 90% stenosis of ostial LCX with ulcerated plaque that extended into the distal left main (although disruption was not flow-limiting in the left main or LAD) and otherwise mild non-obstructive disease. Due to the location of the stenosis, there was not a good place for stent as it would extend from the mid left main into the mid LCX and would jail the LAD. Therefore, medical therapy was recommended.  She was last seen by Dr. Servando Salina on 03/26/2022 for follow-up at which time she was doing well since discharge with no recurrent anginal symptoms. ABIs were ordered in 03/2022 by her PCP and were normal.   She called our office in 06/2022 with complaints of recurrent chest pain. She had multiple office visit scheduled following this for further evaluation but cancelled these.  Patient presents today for follow-up. ***  Chest Pain CAD Patient was admitted in 03/2022 for NSTEMI. LHC showed 90% stenosis of ostial LCX with ulcerated plaque that extended into the distal left main (although disruption was not  flow-limiting in the left main or LAD) and otherwise mild non-obstructive disease. Due to the location of the stenosis, there was not a good place for stent as it would extend from the mid left main into the mid LCX and would jail the LAD. Therefore, medical therapy was recommended. - *** - Continue Amlodipine 5mg  daily as an antianginal. *** - Continue DAPT with Aspirin and Plavix.  - Continue high-intensity statin.   Hyperlipidemia Lipid panel in 03/2022: Total Cholesterol 175, Triglycerides 64, HDL 43, LDL 119. LDL goal <70 given CAD.  - Currently on Lipitor 40mg  daily. Will increase to 80mg  daily.  - Will then repeat lipid panel and LFTs in 6-8 weeks.   EKGs/Labs/Other Studies Reviewed:    The following studies were reviewed:  Echocardiogram 03/06/2022: Impressions: 1. Left ventricular ejection fraction by 3D volume is 58 %. The left  ventricle has normal function. The left ventricle has no regional wall  motion abnormalities. Left ventricular diastolic parameters were normal.  The average left ventricular global  longitudinal strain is -19.3 %. The global longitudinal strain is normal.   2. Right ventricular systolic function is normal. The right ventricular  size is normal. There is normal pulmonary artery systolic pressure.   3. The mitral valve is normal in structure. No evidence of mitral valve  regurgitation. No evidence of mitral stenosis.   4. The aortic valve is normal in structure. Aortic valve regurgitation is  not visualized. No aortic stenosis is present.   5. The inferior  vena cava is normal in size with greater than 50%  respiratory variability, suggesting right atrial pressure of 3 mmHg.  _______________  Left Cardiac Catheterization 03/06/2022:   Prox Cx lesion is 25% stenosed.   Ost Cx lesion is 90% stenosed.   Ost LAD to Mid LAD lesion is 25% stenosed.   Mid LAD lesion is 25% stenosed.   Dist LM lesion is 25% stenosed.   The left ventricular systolic function  is normal.   LV end diastolic pressure is normal.   The left ventricular ejection fraction is 55-65% by visual estimate.   There is no aortic valve stenosis.   Severe disease in the ostial circumflex.  Appears to be ulcerated plaque in the ostial circumflex that also extends into the distal left main.  The disruption is not flow-limiting in the left main or LAD.  Due to the location of the stenosis, it is not in a good place for a stent.  A stent would have to extend from the mid left main into the mid circumflex and jailed the LAD.  Given the anatomy, will treat with medication.  300 mg Plavix started here.  Continue dual antiplatelet therapy.  Hopefully, DAPT along with high-dose statin therapy will help the ulceration heal.  Add antianginal therapy as needed as well.    Diagnostic Dominance: Right    _______________  ABIs 04/20/2022: Impressions: Normal exam. No evidence of hemodynamically significant peripheral arterial disease.  EKG:  EKG  ordered today. EKG personally reviewed and demonstrates ***.  Recent Labs: 03/05/2022: ALT 15; B Natriuretic Peptide 26.7 03/07/2022: BUN 11; Creatinine, Ser 0.64; Hemoglobin 12.9; Magnesium 2.2; Platelets 228; Potassium 3.6; Sodium 137  Recent Lipid Panel    Component Value Date/Time   CHOL 175 03/06/2022 0105   CHOL 193 12/03/2017 0924   TRIG 64 03/06/2022 0105   HDL 43 03/06/2022 0105   HDL 59 12/03/2017 0924   CHOLHDL 4.1 03/06/2022 0105   VLDL 13 03/06/2022 0105   LDLCALC 119 (H) 03/06/2022 0105   LDLCALC 120 (H) 12/03/2017 0924    Physical Exam:    Vital Signs: There were no vitals taken for this visit.    Wt Readings from Last 3 Encounters:  08/19/22 129 lb (58.5 kg)  03/26/22 132 lb (59.9 kg)  03/06/22 132 lb 14.4 oz (60.3 kg)     General: 62 y.o. female in no acute distress. HEENT: Normocephalic and atraumatic. Sclera clear. EOMs intact. Neck: Supple. No carotid bruits. No JVD. Heart: *** RRR. Distinct S1 and S2. No  murmurs, gallops, or rubs. Radial and distal pedal pulses 2+ and equal bilaterally. Lungs: No increased work of breathing. Clear to ausculation bilaterally. No wheezes, rhonchi, or rales.  Abdomen: Soft, non-distended, and non-tender to palpation. Bowel sounds present in all 4 quadrants.  MSK: Normal strength and tone for age. *** Extremities: No lower extremity edema.    Skin: Warm and dry. Neuro: Alert and oriented x3. No focal deficits. Psych: Normal affect. Responds appropriately.   Assessment:    No diagnosis found.  Plan:     Disposition: Follow up in ***   Medication Adjustments/Labs and Tests Ordered: Current medicines are reviewed at length with the patient today.  Concerns regarding medicines are outlined above.  No orders of the defined types were placed in this encounter.  No orders of the defined types were placed in this encounter.   There are no Patient Instructions on file for this visit.   Signed, Corrin Parker, PA-C  09/01/2022 7:28 PM    [facility]

## 2022-09-04 ENCOUNTER — Ambulatory Visit: Payer: Self-pay | Admitting: Student

## 2022-09-05 DIAGNOSIS — M2012 Hallux valgus (acquired), left foot: Secondary | ICD-10-CM | POA: Diagnosis not present

## 2022-09-05 DIAGNOSIS — Q828 Other specified congenital malformations of skin: Secondary | ICD-10-CM | POA: Diagnosis not present

## 2022-09-05 DIAGNOSIS — M792 Neuralgia and neuritis, unspecified: Secondary | ICD-10-CM | POA: Diagnosis not present

## 2022-09-05 DIAGNOSIS — M2042 Other hammer toe(s) (acquired), left foot: Secondary | ICD-10-CM | POA: Diagnosis not present

## 2022-09-05 DIAGNOSIS — M21962 Unspecified acquired deformity of left lower leg: Secondary | ICD-10-CM | POA: Diagnosis not present

## 2022-09-05 DIAGNOSIS — M2041 Other hammer toe(s) (acquired), right foot: Secondary | ICD-10-CM | POA: Diagnosis not present

## 2022-09-24 ENCOUNTER — Ambulatory Visit: Payer: 59 | Attending: Cardiology | Admitting: Cardiology

## 2022-09-24 VITALS — BP 124/78 | HR 83 | Ht 63.5 in | Wt 125.8 lb

## 2022-09-24 DIAGNOSIS — E782 Mixed hyperlipidemia: Secondary | ICD-10-CM | POA: Diagnosis not present

## 2022-09-24 DIAGNOSIS — R5383 Other fatigue: Secondary | ICD-10-CM | POA: Diagnosis not present

## 2022-09-24 DIAGNOSIS — I10 Essential (primary) hypertension: Secondary | ICD-10-CM

## 2022-09-24 DIAGNOSIS — Z79899 Other long term (current) drug therapy: Secondary | ICD-10-CM | POA: Diagnosis not present

## 2022-09-24 DIAGNOSIS — I214 Non-ST elevation (NSTEMI) myocardial infarction: Secondary | ICD-10-CM

## 2022-09-24 DIAGNOSIS — I2510 Atherosclerotic heart disease of native coronary artery without angina pectoris: Secondary | ICD-10-CM

## 2022-09-24 NOTE — Progress Notes (Signed)
Cardiology Office Note:    Date:  09/28/2022   ID:  BRESLYN BJERK, DOB October 29, 1960, MRN [id number]  PCP:  Dot Been, FNP  Cardiologist:  Thomasene Ripple, DO  Electrophysiologist:  None   Referring MD: Dot Been, FNP   " I am having some fatigue"  History of Present Illness:    Haley Turner is a 62 y.o. female with a hx of acute myopericarditis, CAD status cardiac catheterization now on dual antiplatelet therapy, depression.   Since her visit with me in 03/2022 she has developed worsening fatigue. She has not had any shortness of breath or chest pain.  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   debrided, left foot, sub 2nd MT head:  in [place], [place] clinic:  [facility]   Suicide ideation     Past Surgical History:  Procedure Laterality Date   CARDIAC CATHETERIZATION     LEFT HEART CATH AND CORONARY ANGIOGRAPHY N/A 03/06/2022   Procedure: LEFT HEART CATH AND CORONARY ANGIOGRAPHY;  Surgeon: Corky Crafts, MD;  Location[facility] INVASIVE CV LAB;  Service: Cardiovascular;  Laterality: N/A;   LEFT HEART CATHETERIZATION WITH CORONARY ANGIOGRAM N/A 03/12/2011   Procedure: LEFT HEART CATHETERIZATION WITH CORONARY ANGIOGRAM;  Surgeon: Corky Crafts, MD;  Location: [facility] CATH LAB;  Service: Cardiovascular;  Laterality: N/A;   NO PAST SURGERIES      Current Medications: No outpatient medications have been marked as taking for the 09/24/22 encounter (Office Visit) with Thomasene Ripple, DO.     Allergies:   Sulfonamide derivatives   Social History   Socioeconomic History   Marital status: Single    Spouse name: Not on file   Number of children: Not on file   Years of education: Not on file   Highest education level: Not on file  Occupational History   Not on file  Tobacco Use   Smoking status: Former    Current packs/day: 0.00    Types: Cigarettes    Quit date:  01/29/2012    Years since quitting: 10.6   Smokeless tobacco: Never  Vaping Use   Vaping status: Never Used  Substance and Sexual Activity   Alcohol use: Yes    Alcohol/week: 1.0 standard drink of alcohol    Types: 1 Shots of liquor per week    Comment: Socially   Drug use: No   Sexual activity: Yes    Birth control/protection: Condom  Other Topics Concern   Not on file  Social History Narrative   Not on file   Social Determinants of Health   Financial Resource Strain: Not on File (05/18/2021)   Received from [employer] [employer]    Financial Resource Strain: 0  Food Insecurity: No Food Insecurity (03/07/2022)   Hunger Vital Sign    Worried About Running Out of Food in the Last Year: Never true    Ran Out of Food in the Last Year: Never true  Transportation Needs: No Transportation Needs (03/07/2022)   PRAPARE - Administrator, Civil Service (Medical): No    Lack of Transportation (Non-Medical): No  Physical Activity: Not on File (05/18/2021)   Received from [place], [place]   Physical Activity    Physical Activity: 0  Stress: Not on File (05/18/2021)   Received from [facility], [place]   Stress    Stress: 0  Social Connections:  Not on File (05/18/2021)   Received from [place], [place]   Social Connections    Social Connections and Isolation: 0     Family History: The patient's family history includes Arthritis in her mother; Breast cancer in her sister; Hypertension in her brother, father, mother, and sister; Prostate cancer in her brother; Thyroid disease in her brother.  ROS:   Review of Systems  Constitution: Reports fatigue. Negative for decreased appetite, fever and weight gain.  HENT: Negative for congestion, ear discharge, hoarse voice and sore throat.   Eyes: Negative for discharge, redness, vision loss in right eye and visual halos.  Cardiovascular: Negative for chest pain, dyspnea on exertion, leg swelling, orthopnea and palpitations.   Respiratory: Negative for cough, hemoptysis, shortness of breath and snoring.   Endocrine: Negative for heat intolerance and polyphagia.  Hematologic/Lymphatic: Negative for bleeding problem. Does not bruise/bleed easily.  Skin: Negative for flushing, nail changes, rash and suspicious lesions.  Musculoskeletal: Negative for arthritis, joint pain, muscle cramps, myalgias, neck pain and stiffness.  Gastrointestinal: Negative for abdominal pain, bowel incontinence, diarrhea and excessive appetite.  Genitourinary: Negative for decreased libido, genital sores and incomplete emptying.  Neurological: Negative for brief paralysis, focal weakness, headaches and loss of balance.  Psychiatric/Behavioral: Negative for altered mental status, depression and suicidal ideas.  Allergic/Immunologic: Negative for HIV exposure and persistent infections.    EKGs/Labs/Other Studies Reviewed:    The following studies were reviewed today:   EKG:  The ekg ordered today demonstrates sinus rhythm 83 bpm  Recent Labs: 03/05/2022: B Natriuretic Peptide 26.7 09/24/2022: ALT 22; BUN 15; Creatinine, Ser 0.77; Hemoglobin 14.1; Magnesium 2.2; Platelets 271; Potassium 4.1; Sodium 143; TSH 1.660  Recent Lipid Panel    Component Value Date/Time   CHOL 175 03/06/2022 0105   CHOL 193 12/03/2017 0924   TRIG 64 03/06/2022 0105   HDL 43 03/06/2022 0105   HDL 59 12/03/2017 0924   CHOLHDL 4.1 03/06/2022 0105   VLDL 13 03/06/2022 0105   LDLCALC 119 (H) 03/06/2022 0105   LDLCALC 120 (H) 12/03/2017 0924    Physical Exam:    VS:  BP 124/78   Pulse 83   Ht 5' 3.5" (1.613 m)   Wt 125 lb 12.8 oz (57.1 kg)   SpO2 97%   BMI 21.93 kg/m     Wt Readings from Last 3 Encounters:  09/24/22 125 lb 12.8 oz (57.1 kg)  08/19/22 129 lb (58.5 kg)  03/26/22 132 lb (59.9 kg)     GEN: Well nourished, well developed in no acute distress HEENT: Normal NECK: No JVD; No carotid bruits LYMPHATICS: No lymphadenopathy CARDIAC: S1S2  noted,RRR, no murmurs, rubs, gallops RESPIRATORY:  Clear to auscultation without rales, wheezing or rhonchi  ABDOMEN: Soft, non-tender, non-distended, +bowel sounds, no guarding. EXTREMITIES: No edema, No cyanosis, no clubbing MUSCULOSKELETAL:  No deformity  SKIN: Warm and dry NEUROLOGIC:  Alert and oriented x 3, non-focal PSYCHIATRIC:  Normal affect, good insight  ASSESSMENT:    1. Other fatigue   2. NSTEMI (non-ST elevation myocardial infarction) (HCC)   3. Medication management   4. Coronary artery disease involving native coronary artery of native heart without angina pectoris   5. Mixed hyperlipidemia   6. Essential hypertension    PLAN:    No anginal symptoms at this time. Continue current regimen with Aspirin and statin.   Blood pressure is at target. No medication changes.   Her fatigue is concerning will get blood work for basic understanding for etiology.  The patient is in agreement with the above plan. The patient left the office in stable condition.  The patient will follow up in   Medication Adjustments/Labs and Tests Ordered: Current medicines are reviewed at length with the patient today.  Concerns regarding medicines are outlined above.  Orders Placed This Encounter  Procedures   TSH+T4F+T3Free   CBC   Comprehensive metabolic panel   Magnesium   Vitamin D 1,25 dihydroxy   EKG 12-Lead   No orders of the defined types were placed in this encounter.   Patient Instructions  Medication Instructions:  No changes *If you need a refill on your cardiac medications before your next appointment, please call your pharmacy*   Lab Work: CBC, CMP, Mag, TSH, T4/T3, Vit D If you have labs (blood work) drawn today and your tests are completely normal, you will receive your results only by: MyChart Message (if you have MyChart) OR A paper copy in the mail If you have any lab test that is abnormal or we need to change your treatment, we will call you to review the  results.  Follow-Up: At [facility], you and your health needs are our priority.  As part of our continuing mission to provide you with exceptional heart care, we have created designated Provider Care Teams.  These Care Teams include your primary Cardiologist (physician) and Advanced Practice Providers (APPs -  Physician Assistants and Nurse Practitioners) who all work together to provide you with the care you need, when you need it.  We recommend signing up for the patient portal called "MyChart".  Sign up information is provided on this After Visit Summary.  MyChart is used to connect with patients for Virtual Visits (Telemedicine).  Patients are able to view lab/test results, encounter notes, upcoming appointments, etc.  Non-urgent messages can be sent to your provider as well.   To learn more about what you can do with MyChart, go to [email or web address].    Your next appointment:   6 month(s)  Provider:   Thomasene Ripple, DO     Other Instructions Patient Care Center- [facility] Assunta Curtis, FNP  [facility] [address] [place],  [place]  [address] Main: [phone]   Adopting a Healthy Lifestyle.  Know what a healthy weight is for you (roughly BMI <25) and aim to maintain this   Aim for 7+ servings of fruits and vegetables daily   65-80+ fluid ounces of water or unsweet tea for healthy kidneys   Limit to max 1 drink of alcohol per day; avoid smoking/tobacco   Limit animal fats in diet for cholesterol and heart health - choose grass fed whenever available   Avoid highly processed foods, and foods high in saturated/trans fats   Aim for low stress - take time to unwind and care for your mental health   Aim for 150 min of moderate intensity exercise weekly for heart health, and weights twice weekly for bone health   Aim for 7-9 hours of sleep daily   When it comes to diets, agreement about the perfect plan isnt easy to find, even among the  experts. Experts at the [facility] of [employer] developed an idea known as the Healthy Eating Plate. Just imagine a plate divided into logical, healthy portions.   The emphasis is on diet quality:   Load up on vegetables and fruits - one-half of your plate: Aim for color and variety, and remember that potatoes dont count.  Go for whole grains - one-quarter of your plate: Whole wheat, barley, wheat berries, quinoa, oats, brown rice, and foods made with them. If you want pasta, go with whole wheat pasta.   Protein power - one-quarter of your plate: Fish, chicken, beans, and nuts are all healthy, versatile protein sources. Limit red meat.   The diet, however, does go beyond the plate, offering a few other suggestions.   Use healthy plant oils, such as olive, canola, soy, corn, sunflower and peanut. Check the labels, and avoid partially hydrogenated oil, which have unhealthy trans fats.   If youre thirsty, drink water. Coffee and tea are good in moderation, but skip sugary drinks and limit milk and dairy products to one or two daily servings.   The type of carbohydrate in the diet is more important than the amount. Some sources of carbohydrates, such as vegetables, fruits, whole grains, and beans-are healthier than others.   Finally, stay active  Osvaldo Shipper, DO  09/28/2022 10:54 PM    [facility]

## 2022-09-24 NOTE — Patient Instructions (Addendum)
Medication Instructions:  No changes *If you need a refill on your cardiac medications before your next appointment, please call your pharmacy*   Lab Work: CBC, CMP, Mag, TSH, T4/T3, Vit D If you have labs (blood work) drawn today and your tests are completely normal, you will receive your results only by: MyChart Message (if you have MyChart) OR A paper copy in the mail If you have any lab test that is abnormal or we need to change your treatment, we will call you to review the results.  Follow-Up: At [facility], you and your health needs are our priority.  As part of our continuing mission to provide you with exceptional heart care, we have created designated Provider Care Teams.  These Care Teams include your primary Cardiologist (physician) and Advanced Practice Providers (APPs -  Physician Assistants and Nurse Practitioners) who all work together to provide you with the care you need, when you need it.  We recommend signing up for the patient portal called "MyChart".  Sign up information is provided on this After Visit Summary.  MyChart is used to connect with patients for Virtual Visits (Telemedicine).  Patients are able to view lab/test results, encounter notes, upcoming appointments, etc.  Non-urgent messages can be sent to your provider as well.   To learn more about what you can do with MyChart, go to [email or web address].    Your next appointment:   6 month(s)  Provider:   Thomasene Ripple, DO     Other Instructions Patient Care Center- [facility] Assunta Curtis, FNP  [facility] [address] [place],  [place]  [address] Main: [phone]

## 2022-10-02 LAB — COMPREHENSIVE METABOLIC PANEL
ALT: 22 IU/L (ref 0–32)
AST: 23 IU/L (ref 0–40)
Albumin: 4.4 g/dL (ref 3.9–4.9)
Alkaline Phosphatase: 88 IU/L (ref 44–121)
BUN/Creatinine Ratio: 19 (ref 12–28)
BUN: 15 mg/dL (ref 8–27)
Bilirubin Total: 0.3 mg/dL (ref 0.0–1.2)
CO2: 24 mmol/L (ref 20–29)
Calcium: 9.7 mg/dL (ref 8.7–10.3)
Chloride: 105 mmol/L (ref 96–106)
Creatinine, Ser: 0.77 mg/dL (ref 0.57–1.00)
Globulin, Total: 2.6 g/dL (ref 1.5–4.5)
Glucose: 96 mg/dL (ref 70–99)
Potassium: 4.1 mmol/L (ref 3.5–5.2)
Sodium: 143 mmol/L (ref 134–144)
Total Protein: 7 g/dL (ref 6.0–8.5)
eGFR: 87 mL/min/{1.73_m2} (ref >59)

## 2022-10-02 LAB — MAGNESIUM: Magnesium: 2.2 mg/dL (ref 1.6–2.3)

## 2022-10-02 LAB — CBC
Hematocrit: 43.9 % (ref 34.0–46.6)
Hemoglobin: 14.1 g/dL (ref 11.1–15.9)
MCH: 28.2 pg (ref 26.6–33.0)
MCHC: 32.1 g/dL (ref 31.5–35.7)
MCV: 88 fL (ref 79–97)
Platelets: 271 10*3/uL (ref 150–450)
RBC: 5 x10E6/uL (ref 3.77–5.28)
RDW: 12.9 % (ref 11.7–15.4)
WBC: 5.9 10*3/uL (ref 3.4–10.8)

## 2022-10-02 LAB — VITAMIN D 1,25 DIHYDROXY
Vitamin D 1, 25 (OH)2 Total: 90 pg/mL — ABNORMAL HIGH
Vitamin D2 1, 25 (OH)2: <10 pg/mL
Vitamin D3 1, 25 (OH)2: 87 pg/mL

## 2022-10-02 LAB — TSH+T4F+T3FREE
Free T4: 1.27 ng/dL (ref 0.82–1.77)
T3, Free: 2.6 pg/mL (ref 2.0–4.4)
TSH: 1.66 u[IU]/mL (ref 0.450–4.500)

## 2022-10-16 NOTE — Progress Notes (Deleted)
Cardiology Office Note:    Date:  10/16/2022   ID:  Haley Turner, DOB Oct 23, 1960, MRN [id number]  PCP:  Dot Been, FNP  Cardiologist:  Thomasene Ripple, DO  Electrophysiologist:  None   Referring MD: Dot Been, FNP   Chief Complaint: ***  History of Present Illness:    Haley Turner is a 62 y.o. female with a history of CAD with 90% stenosis of ostial LCX with ulcerated plaque that extended into the distal left main on cardiac catheterization in 03/2022 (not felt to be a good location for a stent; therefore, treated medically), myopericarditis in 2013, hyperlipidemia, and depression who is followed by Dr. Servando Salina and presents today for ***  Patient was first seen by Dr. Servando Salina during an admission in 03/2022 for NSTEMI. Echo at that time showed LVEF of 58% with normal wall motion and diastolic parameters, normal RV, and no significant valvular disease. LHC showed 90% stenosis of ostial LCX with ulcerated plaque that extended into the distal left main (although disruption was not flow-limiting in the left main or LAD) and otherwise mild non-obstructive disease. Due to the location of the stenosis, there was not a good place for stent as it would extend from the mid left main into the mid LCX and would jail the LAD. Therefore, medical therapy was recommended.  She was last seen by Dr. Servando Salina on 03/26/2022 for follow-up at which time she was doing well since discharge with no recurrent anginal symptoms. ABIs were ordered in 03/2022 by her PCP and were normal.   She called our office in 06/2022 with complaints of recurrent chest pain. She had multiple office visit scheduled following this for further evaluation but cancelled these. She was seen by Dr. Servando Salina on 09/24/2022 at which time she denied any chest pain or shortness of breath but reported worsening fatigue. Basic labs (including CBC, CMET, TSH, free T4, free T3, Magnesium, and Vitamin D) were checked and were unremarkable.   Patient presents  today for follow-up. ***  CAD Patient was admitted in 03/2022 for NSTEMI. LHC showed 90% stenosis of ostial LCX with ulcerated plaque that extended into the distal left main (although disruption was not flow-limiting in the left main or LAD) and otherwise mild non-obstructive disease. Due to the location of the stenosis, there was not a good place for stent as it would extend from the mid left main into the mid LCX and would jail the LAD. Therefore, medical therapy was recommended. - *** - Continue Amlodipine 5mg  daily as an antianginal. *** - Continue DAPT with Aspirin and Plavix.  - Continue high-intensity statin.   Hyperlipidemia Lipid panel in 03/2022: Total Cholesterol 175, Triglycerides 64, HDL 43, LDL 119. LDL goal <70 given CAD.  - Currently on Lipitor 40mg  daily. Will increase to 80mg  daily. *** - Will then repeat lipid panel and LFTs in 6-8 weeks. ***  EKGs/Labs/Other Studies Reviewed:    The following studies were reviewed:  Echocardiogram 03/06/2022: Impressions: 1. Left ventricular ejection fraction by 3D volume is 58 %. The left  ventricle has normal function. The left ventricle has no regional wall  motion abnormalities. Left ventricular diastolic parameters were normal.  The average left ventricular global  longitudinal strain is -19.3 %. The global longitudinal strain is normal.   2. Right ventricular systolic function is normal. The right ventricular  size is normal. There is normal pulmonary artery systolic pressure.   3. The mitral valve is normal in structure. No evidence  of mitral valve  regurgitation. No evidence of mitral stenosis.   4. The aortic valve is normal in structure. Aortic valve regurgitation is  not visualized. No aortic stenosis is present.   5. The inferior vena cava is normal in size with greater than 50%  respiratory variability, suggesting right atrial pressure of 3 mmHg.  _______________  Left Cardiac Catheterization 03/06/2022:   Prox Cx  lesion is 25% stenosed.   Ost Cx lesion is 90% stenosed.   Ost LAD to Mid LAD lesion is 25% stenosed.   Mid LAD lesion is 25% stenosed.   Dist LM lesion is 25% stenosed.   The left ventricular systolic function is normal.   LV end diastolic pressure is normal.   The left ventricular ejection fraction is 55-65% by visual estimate.   There is no aortic valve stenosis.   Severe disease in the ostial circumflex.  Appears to be ulcerated plaque in the ostial circumflex that also extends into the distal left main.  The disruption is not flow-limiting in the left main or LAD.  Due to the location of the stenosis, it is not in a good place for a stent.  A stent would have to extend from the mid left main into the mid circumflex and jailed the LAD.  Given the anatomy, will treat with medication.  300 mg Plavix started here.  Continue dual antiplatelet therapy.  Hopefully, DAPT along with high-dose statin therapy will help the ulceration heal.  Add antianginal therapy as needed as well.  Diagnostic Dominance: Right  _______________  ABIs 04/20/2022: Impressions: Normal exam. No evidence of hemodynamically significant peripheral arterial disease.    EKG:  EKG not ordered today.  Recent Labs: 03/05/2022: B Natriuretic Peptide 26.7 09/24/2022: ALT 22; BUN 15; Creatinine, Ser 0.77; Hemoglobin 14.1; Magnesium 2.2; Platelets 271; Potassium 4.1; Sodium 143; TSH 1.660  Recent Lipid Panel    Component Value Date/Time   CHOL 175 03/06/2022 0105   CHOL 193 12/03/2017 0924   TRIG 64 03/06/2022 0105   HDL 43 03/06/2022 0105   HDL 59 12/03/2017 0924   CHOLHDL 4.1 03/06/2022 0105   VLDL 13 03/06/2022 0105   LDLCALC 119 (H) 03/06/2022 0105   LDLCALC 120 (H) 12/03/2017 0924    Physical Exam:    Vital Signs: There were no vitals taken for this visit.    Wt Readings from Last 3 Encounters:  09/24/22 125 lb 12.8 oz (57.1 kg)  08/19/22 129 lb (58.5 kg)  03/26/22 132 lb (59.9 kg)     General: 62 y.o.  female in no acute distress. HEENT: Normocephalic and atraumatic. Sclera clear. EOMs intact. Neck: Supple. No carotid bruits. No JVD. Heart: *** RRR. Distinct S1 and S2. No murmurs, gallops, or rubs. Radial and distal pedal pulses 2+ and equal bilaterally. Lungs: No increased work of breathing. Clear to ausculation bilaterally. No wheezes, rhonchi, or rales.  Abdomen: Soft, non-distended, and non-tender to palpation. Bowel sounds present in all 4 quadrants.  MSK: Normal strength and tone for age. *** Extremities: No lower extremity edema.    Skin: Warm and dry. Neuro: Alert and oriented x3. No focal deficits. Psych: Normal affect. Responds appropriately.   Assessment:    No diagnosis found.  Plan:     Disposition: Follow up in ***   Medication Adjustments/Labs and Tests Ordered: Current medicines are reviewed at length with the patient today.  Concerns regarding medicines are outlined above.  No orders of the defined types were placed in this encounter.  No orders of the defined types were placed in this encounter.   There are no Patient Instructions on file for this visit.   Signed, Corrin Parker, PA-C  10/16/2022 11:56 PM    [facility]

## 2022-10-30 ENCOUNTER — Ambulatory Visit: Payer: 59 | Admitting: Student

## 2022-12-03 ENCOUNTER — Other Ambulatory Visit: Payer: Self-pay | Admitting: Family Medicine

## 2022-12-03 DIAGNOSIS — I10 Essential (primary) hypertension: Secondary | ICD-10-CM | POA: Diagnosis not present

## 2022-12-03 DIAGNOSIS — Z113 Encounter for screening for infections with a predominantly sexual mode of transmission: Secondary | ICD-10-CM | POA: Diagnosis not present

## 2022-12-03 DIAGNOSIS — E782 Mixed hyperlipidemia: Secondary | ICD-10-CM | POA: Diagnosis not present

## 2022-12-03 DIAGNOSIS — Z1211 Encounter for screening for malignant neoplasm of colon: Secondary | ICD-10-CM | POA: Diagnosis not present

## 2022-12-03 DIAGNOSIS — Z0001 Encounter for general adult medical examination with abnormal findings: Secondary | ICD-10-CM | POA: Diagnosis not present

## 2022-12-03 DIAGNOSIS — Z1231 Encounter for screening mammogram for malignant neoplasm of breast: Secondary | ICD-10-CM

## 2022-12-31 ENCOUNTER — Encounter: Payer: Self-pay | Admitting: Physician Assistant

## 2022-12-31 ENCOUNTER — Other Ambulatory Visit (INDEPENDENT_AMBULATORY_CARE_PROVIDER_SITE_OTHER): Payer: 59

## 2022-12-31 ENCOUNTER — Ambulatory Visit (INDEPENDENT_AMBULATORY_CARE_PROVIDER_SITE_OTHER): Payer: 59 | Admitting: Physician Assistant

## 2022-12-31 DIAGNOSIS — M25512 Pain in left shoulder: Secondary | ICD-10-CM

## 2022-12-31 MED ORDER — METHOCARBAMOL 500 MG PO TABS: 500.0000 mg | ORAL_TABLET | Freq: Two times a day (BID) | ORAL | 0 refills | Status: AC

## 2022-12-31 NOTE — Addendum Note (Signed)
Addended by: Richardean Canal on: 12/31/2022 05:10 PM   Modules accepted: Orders

## 2022-12-31 NOTE — Progress Notes (Signed)
Office Visit Note   Patient: Haley Turner           Date of Birth: Nov 02, 1960           MRN: [id number] Visit Date: 12/31/2022              Requested by: Dot Been, FNP [address] [place],  [place] [address] PCP: Dot Been, FNP   Assessment & Plan: Visit Diagnoses:  1. Acute pain of left shoulder     Plan:  She would like to stay very conservative at this point in time.  She is asking for Robaxin to be sent in for her as this seems to help especially with her nighttime pain.  She will call us if she wants an intra-articular injection left shoulder or referral to Dr. August Saucer for possible shoulder replacement.  Questions were encouraged and answered at length.  Follow-up as needed  Follow-Up Instructions: Return if symptoms worsen or fail to improve.   Orders:  Orders Placed This Encounter  Procedures   XR Shoulder Left   No orders of the defined types were placed in this encounter.     Procedures: No procedures performed   Clinical Data: No additional findings.   Subjective: Chief Complaint  Patient presents with   Left Shoulder - Pain    HPI Haley Turner 62 year old female comes in today with left shoulder pain has been ongoing since she fell in July.  She states she fell over a crate in the store.  States her shoulder pain was severe yesterday 10 out of 10 pain she could not raise the arm at all.  Today she is able to raise the arm to the shoulder level she states.  Pain does awaken her.  No real radicular symptoms.  She is taking Tylenol.  She is right-hand dominant.  She does note that she took Robaxin last night and this helped with her pain.  She works as a Scientist, clinical (histocompatibility and immunogenetics).  Review of Systems Negative for fevers chills.  Objective: Vital Signs: There were no vitals taken for this visit.  Physical Exam Constitutional:      Appearance: She is normal weight. She is not ill-appearing or diaphoretic.  Pulmonary:     Effort: Pulmonary effort is normal.   Neurological:     Mental Status: She is alert and oriented to person, place, and time.  Psychiatric:        Mood and Affect: Mood normal.     Ortho Exam Bilateral shoulders 5 out of 5 strength external and internal rotation against resistance.  Liftoff test negative bilaterally.  Empty can test is negative bilaterally.  Right shoulder full overhead activity.  Left shoulder 140 degrees actively.  Specialty Comments:  No specialty comments available.  Imaging: XR Shoulder Left  Result Date: 12/31/2022 Left shoulder 3 views: End-stage glenohumeral arthritis.  Shoulder is well located.  No acute fractures or acute findings.    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   CHEST PAIN 09/23/2009   CHLAMYDIAL INFECTION,  HX OF 09/23/2009   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   debrided, left foot, sub 2nd MT head:  in [place], [place] clinic:  [facility]   Suicide ideation     Family History  Problem Relation Age of Onset   Arthritis Mother    Hypertension Mother    Hypertension Father    Breast cancer Sister    Hypertension Sister    Hypertension Brother    Prostate cancer Brother    Thyroid disease Brother     Past Surgical History:  Procedure Laterality Date   CARDIAC CATHETERIZATION     LEFT HEART CATH AND CORONARY ANGIOGRAPHY N/A 03/06/2022   Procedure: LEFT HEART CATH  AND CORONARY ANGIOGRAPHY;  Surgeon: Corky Crafts, MD;  Location: [facility] INVASIVE CV LAB;  Service: Cardiovascular;  Laterality: N/A;   LEFT HEART CATHETERIZATION WITH CORONARY ANGIOGRAM N/A 03/12/2011   Procedure: LEFT HEART CATHETERIZATION WITH CORONARY ANGIOGRAM;  Surgeon: Corky Crafts, MD;  Location: [facility] CATH LAB;  Service: Cardiovascular;  Laterality: N/A;   NO PAST SURGERIES     Social History   Occupational History   Not on file  Tobacco Use   Smoking status: Former    Current packs/day: 0.00    Types: Cigarettes    Quit date: 01/29/2012    Years since quitting: 10.9   Smokeless tobacco: Never  Vaping Use   Vaping status: Never Used  Substance and Sexual Activity   Alcohol use: Yes    Alcohol/week: 1.0 standard drink of alcohol    Types: 1 Shots of liquor per week    Comment: Socially   Drug use: No   Sexual activity: Yes    Birth control/protection: Condom

## 2023-01-25 ENCOUNTER — Ambulatory Visit
Admission: RE | Admit: 2023-01-25 | Discharge: 2023-01-25 | Disposition: A | Discharge status: Home / Self Care | Payer: Medicaid Other | Source: Ambulatory Visit | Attending: Family Medicine | Admitting: Family Medicine

## 2023-01-25 DIAGNOSIS — Z1231 Encounter for screening mammogram for malignant neoplasm of breast: Secondary | ICD-10-CM | POA: Diagnosis not present

## 2023-04-05 DIAGNOSIS — J069 Acute upper respiratory infection, unspecified: Secondary | ICD-10-CM | POA: Diagnosis not present

## 2023-04-05 DIAGNOSIS — H0011 Chalazion right upper eyelid: Secondary | ICD-10-CM | POA: Diagnosis not present

## 2023-04-05 DIAGNOSIS — L539 Erythematous condition, unspecified: Secondary | ICD-10-CM | POA: Diagnosis not present

## 2023-04-05 DIAGNOSIS — I10 Essential (primary) hypertension: Secondary | ICD-10-CM | POA: Diagnosis not present

## 2023-05-06 ENCOUNTER — Encounter: Payer: Self-pay | Admitting: Emergency Medicine

## 2023-05-06 ENCOUNTER — Ambulatory Visit: Admission: EM | Admit: 2023-05-06 | Discharge: 2023-05-06 | Disposition: A | Discharge status: Home / Self Care

## 2023-05-06 DIAGNOSIS — M25512 Pain in left shoulder: Secondary | ICD-10-CM

## 2023-05-06 MED ORDER — PREDNISONE 20 MG PO TABS: 40.0000 mg | ORAL_TABLET | Freq: Every day | ORAL | 0 refills | Status: AC

## 2023-05-06 NOTE — ED Provider Notes (Signed)
 [facility]    CSN: [id number] Arrival date & time: 05/06/23  0804      History   Chief Complaint Chief Complaint  Patient presents with   Shoulder Pain    HPI Haley Turner is a 63 y.o. female.   Patient presents today for evaluation of left shoulder pain that started about a week ago.  She reports that movement worsens pain.  At times pain will radiate to her collarbone area.  She denies any other chest pain or shortness of breath.  She has not had any lightheadedness.  She was treated for significant arthritis about 4 months ago and same shoulder with same presentation.  She was prescribed muscle relaxers which she has tried without resolution.  She denies any numbness or tingling.  The history is provided by the patient.    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   debrided, left foot, sub 2nd MT head:  in [place], [place] clinic:  [facility]   Suicide ideation     Patient Active Problem List   Diagnosis Date Noted   History of non-ST elevation myocardial infarction (NSTEMI) 04/29/2022   Acute non-ST elevation myocardial infarction (NSTEMI) (HCC) 03/18/2022   Mixed anxiety and depressive disorder 03/18/2022   NSTEMI (non-ST elevated myocardial infarction) (HCC) 03/06/2022   NSTEMI (non-ST elevation myocardial infarction) (HCC) 03/05/2022   GAD (generalized anxiety disorder) 03/05/2022   Non-ST elevation (NSTEMI) myocardial infarction (HCC) 03/05/2022   Essential hypertension 02/19/2022   Generalized anxiety disorder 02/18/2022   COVID-19 02/05/2022   Tobacco use 10/30/2017   Foot callus 03/05/2017   Hyperlipidemia 10/25/2009   GONORRHEA 09/23/2009   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   PANIC DISORDER 09/23/2009   Depression 09/23/2009   CARPAL TUNNEL SYNDROME, LEFT 09/23/2009   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   CHEST PAIN 09/23/2009   CHLAMYDIAL INFECTION, HX OF  09/23/2009    Past Surgical History:  Procedure Laterality Date   CARDIAC CATHETERIZATION     LEFT HEART CATH AND CORONARY ANGIOGRAPHY N/A 03/06/2022   Procedure: LEFT HEART CATH AND CORONARY ANGIOGRAPHY;  Surgeon: Corky Crafts, MD;  Location: [facility] INVASIVE CV LAB;  Service: Cardiovascular;  Laterality: N/A;   LEFT HEART CATHETERIZATION WITH CORONARY ANGIOGRAM N/A 03/12/2011   Procedure: LEFT HEART CATHETERIZATION WITH CORONARY ANGIOGRAM;  Surgeon: Corky Crafts, MD;  Location: [facility] CATH LAB;  Service: Cardiovascular;  Laterality: N/A;   NO PAST SURGERIES      OB History   No obstetric history on file.      Home Medications    Prior to Admission medications   Medication Sig Start Date End Date Taking? Authorizing Provider  acetaminophen (TYLENOL) 325 MG tablet Take 650 mg by mouth every 6 (six) hours as needed for moderate pain.   Yes [provider]  albuterol (VENTOLIN HFA) 108 (90 Base) MCG/ACT inhaler Inhale 2 puffs into the lungs every 4 (four) hours as needed for wheezing or shortness of breath.   Yes [provider]  amLODipine (NORVASC) 5 MG tablet Take 1 tablet by mouth daily. 04/09/22  Yes [provider]  aspirin EC 81 MG tablet Take 1 tablet by mouth daily.   Yes [provider]  atorvastatin (LIPITOR) 80 MG tablet Take 80 mg by mouth daily. 11/16/22 11/16/23 Yes [provider]  clopidogrel (PLAVIX) 75 MG tablet Take 1 tablet by mouth daily. 11/16/22 11/16/23 Yes [provider]  methocarbamol (ROBAXIN) 500 MG tablet Take 1 tablet (500 mg total) by mouth 2 (two) times daily. 12/31/22  Yes Kirtland Bouchard, PA-C  predniSONE (DELTASONE) 20 MG tablet Take 2 tablets (40 mg total) by mouth daily with breakfast for 5 days. 05/06/23 05/11/23 Yes Tomi Bamberger, PA-C  sertraline (ZOLOFT) 25 MG tablet Take 25 mg by mouth daily. 02/07/23  Yes [provider]  Vitamin D, Ergocalciferol, (DRISDOL) 1.25 MG (50000 UNIT)  CAPS capsule Take 50,000 Units by mouth every 7 (seven) days. 02/05/23  Yes [provider]  atorvastatin (LIPITOR) 40 MG tablet Take 2 tablets (80 mg total) by mouth daily. Follow up with cardiology for repeat labs. 03/08/22 08/19/22  Zigmund Daniel., MD  buPROPion (WELLBUTRIN XL) 150 MG 24 hr tablet Take 1 tablet by mouth daily. Patient not taking: Reported on 05/06/2023 07/17/22   [provider]  ERGOCALCIFEROL PO Take 500,000 Units by mouth once a week. Patient not taking: Reported on 05/06/2023 12/24/22 06/22/23  [provider]  nicotine (NICODERM CQ - DOSED IN MG/24 HOURS) 21 mg/24hr patch Place 1 patch (21 mg total) onto the skin daily. Patient not taking: Reported on 05/06/2023 03/07/22   Zigmund Daniel., MD    Family History Family History  Problem Relation Age of Onset   Arthritis Mother    Hypertension Mother    Hypertension Father    Breast cancer Sister    Hypertension Sister    Hypertension Brother    Prostate cancer Brother    Thyroid disease Brother     Social History Social History   Tobacco Use   Smoking status: Some Days    Current packs/day: 0.00    Types: Cigarettes    Last attempt to quit: 01/29/2012    Years since quitting: 11.2   Smokeless tobacco: Never  Vaping Use   Vaping status: Never Used  Substance Use Topics   Alcohol use: Yes    Alcohol/week: 1.0 standard drink of alcohol    Types: 1 Shots of liquor per week    Comment: Socially   Drug use: No     Allergies   Sulfonamide derivatives and Sulfur hexafluoride   Review of Systems Review of Systems  Constitutional:  Negative for chills and fever.  Eyes:  Negative for discharge and redness.  Respiratory:  Negative for shortness of breath.   Cardiovascular:  Negative for chest pain.  Gastrointestinal:  Negative for abdominal pain, nausea and vomiting.  Musculoskeletal:  Positive for arthralgias.     Physical Exam Triage Vital Signs ED Triage Vitals   Encounter Vitals Group     BP      Systolic BP Percentile      Diastolic BP Percentile      Pulse      Resp      Temp      Temp src      SpO2      Weight      Height      Head Circumference      Peak Flow      Pain Score      Pain Loc      Pain Education      Exclude from Growth Chart    No data found.  Updated Vital Signs BP 131/68 (BP Location: Left Arm)   Pulse 70   Temp 98.1 F (36.7 C) (Oral)   Resp 16   SpO2 97%   Visual Acuity Right Eye Distance:  Left Eye Distance:   Bilateral Distance:    Right Eye Near:   Left Eye Near:    Bilateral Near:     Physical Exam Vitals and nursing note reviewed.  Constitutional:      General: She is not in acute distress.    Appearance: Normal appearance. She is not ill-appearing.  HENT:     Head: Normocephalic and atraumatic.  Eyes:     Conjunctiva/sclera: Conjunctivae normal.  Cardiovascular:     Rate and Rhythm: Normal rate and regular rhythm.  Pulmonary:     Effort: Pulmonary effort is normal. No respiratory distress.     Breath sounds: No wheezing, rhonchi or rales.  Musculoskeletal:     Comments: Decreased range of motion of left shoulder in all directions due to pain.  Mild tenderness to palpation noted to anterior left shoulder diffusely.  Mild tenderness also noted below left clavicle.  Skin:    Capillary Refill: Normal cap refill to left fingers Neurological:     Mental Status: She is alert.     Comments: Gross sensation intact to left fingers distally  Psychiatric:        Mood and Affect: Mood normal.        Behavior: Behavior normal.        Thought Content: Thought content normal.      UC Treatments / Results  Labs (all labs ordered are listed, but only abnormal results are displayed) Labs Reviewed - No data to display  EKG   Radiology No results found.  Procedures Procedures (including critical care time)  Medications Ordered in UC Medications - No data to display  Initial  Impression / Assessment and Plan / UC Course  I have reviewed the triage vital signs and the nursing notes.  Pertinent labs & imaging results that were available during my care of the patient were reviewed by me and considered in my medical decision making (see chart for details).    Suspect arthritic flare given history with similar presentation and lack of other cardiac related symptoms.  Will treat with steroid burst with very strong recommendation to report to the emergency room should symptoms worsen in any way or other symptoms develop.  Patient expresses understanding.  Final Clinical Impressions(s) / UC Diagnoses   Final diagnoses:  Acute pain of left shoulder     Discharge Instructions       Report to ED immediately with any worsening or new symptoms.      ED Prescriptions     Medication Sig Dispense Auth. Provider   predniSONE (DELTASONE) 20 MG tablet Take 2 tablets (40 mg total) by mouth daily with breakfast for 5 days. 10 tablet Tomi Bamberger, PA-C      PDMP not reviewed this encounter.   Tomi Bamberger, PA-C 05/06/23 1143

## 2023-05-06 NOTE — Discharge Instructions (Signed)
  Report to ED immediately with any worsening or new symptoms.

## 2023-05-06 NOTE — ED Triage Notes (Signed)
 Pt reports severe L shoulder pain x1 week that has started radiating into her collarbone. Pt reports she had one other episode of pain this bad 4 months ago and that's when she received her severe arthritis diagnosis from her MD. Pt reports muscle relaxants were prescribed for her then, but those are not helping with the pain. Orthopedic dr told her there is no cartilage in L shoulder joint. She is unable to raise arm far up and above head. Even raising it slightly increases pain.

## 2023-05-10 DIAGNOSIS — H35341 Macular cyst, hole, or pseudohole, right eye: Secondary | ICD-10-CM | POA: Diagnosis not present

## 2023-05-10 DIAGNOSIS — H35371 Puckering of macula, right eye: Secondary | ICD-10-CM | POA: Diagnosis not present

## 2023-05-10 DIAGNOSIS — H0011 Chalazion right upper eyelid: Secondary | ICD-10-CM | POA: Diagnosis not present

## 2023-05-10 DIAGNOSIS — H524 Presbyopia: Secondary | ICD-10-CM | POA: Diagnosis not present

## 2023-05-10 DIAGNOSIS — H401134 Primary open-angle glaucoma, bilateral, indeterminate stage: Secondary | ICD-10-CM | POA: Diagnosis not present

## 2023-05-10 DIAGNOSIS — H25813 Combined forms of age-related cataract, bilateral: Secondary | ICD-10-CM | POA: Diagnosis not present

## 2023-07-12 DIAGNOSIS — E559 Vitamin D deficiency, unspecified: Secondary | ICD-10-CM | POA: Diagnosis not present

## 2023-07-12 DIAGNOSIS — Z01818 Encounter for other preprocedural examination: Secondary | ICD-10-CM | POA: Diagnosis not present

## 2023-07-12 DIAGNOSIS — Z1212 Encounter for screening for malignant neoplasm of rectum: Secondary | ICD-10-CM | POA: Diagnosis not present

## 2023-07-12 DIAGNOSIS — R8290 Unspecified abnormal findings in urine: Secondary | ICD-10-CM | POA: Diagnosis not present

## 2023-07-12 DIAGNOSIS — I10 Essential (primary) hypertension: Secondary | ICD-10-CM | POA: Diagnosis not present

## 2023-07-12 DIAGNOSIS — F330 Major depressive disorder, recurrent, mild: Secondary | ICD-10-CM | POA: Diagnosis not present

## 2023-07-12 DIAGNOSIS — Z1211 Encounter for screening for malignant neoplasm of colon: Secondary | ICD-10-CM | POA: Diagnosis not present

## 2023-07-12 DIAGNOSIS — J029 Acute pharyngitis, unspecified: Secondary | ICD-10-CM | POA: Diagnosis not present

## 2023-07-12 DIAGNOSIS — J069 Acute upper respiratory infection, unspecified: Secondary | ICD-10-CM | POA: Diagnosis not present

## 2023-07-12 DIAGNOSIS — E782 Mixed hyperlipidemia: Secondary | ICD-10-CM | POA: Diagnosis not present

## 2023-07-12 DIAGNOSIS — R350 Frequency of micturition: Secondary | ICD-10-CM | POA: Diagnosis not present

## 2023-07-12 DIAGNOSIS — I214 Non-ST elevation (NSTEMI) myocardial infarction: Secondary | ICD-10-CM | POA: Diagnosis not present

## 2023-07-20 DIAGNOSIS — Z1212 Encounter for screening for malignant neoplasm of rectum: Secondary | ICD-10-CM | POA: Diagnosis not present

## 2023-07-20 DIAGNOSIS — Z1211 Encounter for screening for malignant neoplasm of colon: Secondary | ICD-10-CM | POA: Diagnosis not present

## 2023-07-22 NOTE — Progress Notes (Signed)
 Cardiology Office Note:    Date:  07/26/2023   ID:  Haley Turner, DOB [birth year], MRN [id number]  PCP:  Earvin Johnston PARAS, FNP   [facility] Providers Cardiologist:  Dub Huntsman, DO Cardiology APP:  Madie Jon Garre, PA     Referring MD: Earvin Johnston PARAS, FNP   Chief Complaint  Patient presents with   Pre-op Exam    History of Present Illness:    Haley Turner is a 63 y.o. female with a hx of HTN, HLD, depression, myopericarditis in 2013, tobacco abuse, and CAD. Heart cath 03/2022 with severe ostial LCX disease with ulcerated plaque that extends into the distal left main. Due to location, plaque not felt amenable to PCI due to jailing LAD. She was treated medically with DAPT x 12 months. Echocardiogram showed LVEF 58% with no RWMA, and no significant valvular disease. LE dopplers negative for PAD.   She now presents for preoperative risk evaluation prior to one tooth extraction with local anesthesia. She denies chest pain, SOB, or syncope. 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   She continues to smoke.  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   debrided, left foot, sub 2nd MT head:  in [place], [place] clinic:  [facility]   Suicide ideation     Past Surgical History:  Procedure Laterality Date   CARDIAC CATHETERIZATION     LEFT HEART CATH AND CORONARY ANGIOGRAPHY N/A 03/06/2022   Procedure: LEFT HEART CATH AND CORONARY ANGIOGRAPHY;  Surgeon: Dann Candyce RAMAN, MD;  Location: [facility] INVASIVE CV LAB;  Service: Cardiovascular;  Laterality: N/A;   LEFT HEART CATHETERIZATION WITH CORONARY ANGIOGRAM N/A 03/12/2011   Procedure: LEFT HEART CATHETERIZATION WITH CORONARY ANGIOGRAM;  Surgeon: Candyce RAMAN Dann, MD;  Location: [facility] CATH LAB;  Service: Cardiovascular;  Laterality: N/A;   NO PAST SURGERIES      Current Medications: Current Meds  Medication Sig    aspirin  EC 81 MG tablet Take 1 tablet by mouth daily.   buPROPion (WELLBUTRIN XL) 150 MG 24 hr tablet Take 1 tablet by mouth daily.   latanoprost (XALATAN) 0.005 % ophthalmic solution Place 1 drop into both eyes at bedtime.   methocarbamol  (ROBAXIN ) 500 MG tablet Take 1 tablet (500 mg total) by mouth 2 (two) times daily. (Patient taking differently: Take 500 mg by mouth as needed.)   nitroGLYCERIN  (NITROSTAT ) 0.4 MG SL tablet Place 1 tablet (0.4 mg total) under the tongue every 5 (five) minutes as needed for chest pain.   sertraline  (ZOLOFT ) 25 MG tablet Take 25 mg by mouth daily.   Vitamin D , Ergocalciferol , (DRISDOL) 1.25 MG (50000 UNIT) CAPS capsule Take 50,000 Units by mouth every 7 (seven) days.   [DISCONTINUED] amLODipine (NORVASC) 5 MG tablet Take 1 tablet by mouth daily.   [DISCONTINUED] atorvastatin  (LIPITOR) 80 MG tablet Take 80 mg by mouth daily.   [DISCONTINUED] clopidogrel  (PLAVIX ) 75 MG tablet Take 1 tablet by mouth daily.     Allergies:   Sulfonamide derivatives, Sulfur, and Sulfur hexafluoride   Social History   Socioeconomic History   Marital status: Single    Spouse name: Not on file   Number of children: Not on file   Years of education: Not on file   Highest education level: Not on file  Occupational History   Not on file  Tobacco Use   Smoking status:  Some Days    Current packs/day: 0.00    Types: Cigarettes    Last attempt to quit: 01/29/2012    Years since quitting: 11.4   Smokeless tobacco: Never   Tobacco comments:    07/26/2023 Patient smokes 2 cigarettes daily  Vaping Use   Vaping status: Never Used  Substance and Sexual Activity   Alcohol use: Yes    Alcohol/week: 1.0 standard drink of alcohol    Types: 1 Shots of liquor per week    Comment: Socially   Drug use: No   Sexual activity: Yes    Birth control/protection: Condom  Other Topics Concern   Not on file  Social History Narrative   Not on file   Social Drivers of Health   Financial  Resource Strain: Not at Risk (12/03/2022)   Received from [employer]    How hard is it for you to pay for the very basics like food, housing, heating, medical care, and medications?: 1  Food Insecurity: Not at Risk (12/03/2022)   Received from [employer] Insecurity    Within the past 12 months, you worried that your food would run out before you got money to buy more.: 1  Transportation Needs: Not at Risk (12/03/2022)   Received from [employer] Needs    In the past 12 months, has lack of transportation kept you from medical appointments, meetings, work or from getting things needed for daily living?: 1  Physical Activity: Not on File (05/18/2021)   Received from [employer]   Physical Activity    Physical Activity: 0  Stress: Not on File (05/18/2021)   Received from [facility]   Stress    Stress: 0  Social Connections: Not on File (10/25/2022)   Received from [facility]   Social Connections    Connectedness: 0     Family History: The patient's family history includes Arthritis in her mother; Breast cancer in her sister; Hypertension in her brother, father, mother, and sister; Prostate cancer in her brother; Thyroid  disease in her brother.  ROS:   Please see the history of present illness.     All other systems reviewed and are negative.  EKGs/Labs/Other Studies Reviewed:    The following studies were reviewed today:       Recent Labs: 09/24/2022: ALT 22; BUN 15; Creatinine, Ser 0.77; Hemoglobin 14.1; Magnesium 2.2; Platelets 271; Potassium 4.1; Sodium 143; TSH 1.660  Recent Lipid Panel    Component Value Date/Time   CHOL 175 03/06/2022 0105   CHOL 193 12/03/2017 0924   TRIG 64 03/06/2022 0105   HDL 43 03/06/2022 0105   HDL 59 12/03/2017 0924   CHOLHDL 4.1 03/06/2022 0105   VLDL 13 03/06/2022 0105   LDLCALC 119 (H) 03/06/2022 0105   LDLCALC 120 (H) 12/03/2017 0924     Risk Assessment/Calculations:                Physical Exam:    VS:  BP  134/68 (BP Location: Left Arm, Patient Position: Sitting, Cuff Size: Normal)   Pulse 76   Ht 5' 3.5 (1.613 m)   Wt 123 lb (55.8 kg)   SpO2 99%   BMI 21.45 kg/m     Wt Readings from Last 3 Encounters:  07/26/23 123 lb (55.8 kg)  09/24/22 125 lb 12.8 oz (57.1 kg)  08/19/22 129 lb (58.5 kg)     GEN:  Well nourished, well developed in no acute distress HEENT: Normal  NECK: No JVD; No carotid bruits LYMPHATICS: No lymphadenopathy CARDIAC: RRR, no murmurs, rubs, gallops RESPIRATORY:  Clear to auscultation without rales, wheezing or rhonchi  ABDOMEN: Soft, non-tender, non-distended MUSCULOSKELETAL:  No edema; No deformity  SKIN: Warm and dry NEUROLOGIC:  Alert and oriented x 3 PSYCHIATRIC:  Normal affect   ASSESSMENT:    1. Coronary artery disease involving native coronary artery of native heart without angina pectoris   2. Essential hypertension   3. Mixed hyperlipidemia   4. Tobacco use   5. Preoperative cardiovascular examination   6. Medication management    PLAN:    In order of problems listed above:  CAD - severe stenosis in the proximal LCX - focus on risk factor management - she was treated with DAPT from 03/2022 - she remains on ASA and plavix  - she ran out of ASA - it has been 12 months, will continue plavix  monotherapy   Hypertension - continue 5 mg amlodipine   Hyperlipidemia with LDL goal < 55 - given disease and smoking history, would attempt a lower LDL goal - need updated labs - continue 80 mg lipitor - LDL was 78 - she does not think she could do PCSK9i, but is interested in inclisiran - will refer to lipid clinic   Preoperative risk evaluation prior to extraction of one tooth.  She has extensive CAD that was not amenable to PCI. She remains active and can complete more than 4.0 METS. She has no unstable cardiac issues. She has a 0.9% risk of MACE for any procedure. Risk with this procedure may be less since she is not having general anesthesia.  No further cardiac workup prior to dental work. We typically do not hold antiplatelet therapy for extraction of 1 tooth. If needed, may hold plavix  for 5 days while taking ASA without interruption.   Will fax to [phone] [facility]  Follow up in 6 months.           Medication Adjustments/Labs and Tests Ordered: Current medicines are reviewed at length with the patient today.  Concerns regarding medicines are outlined above.  Orders Placed This Encounter  Procedures   AMB Referral to [facility]   Meds ordered this encounter  Medications   nitroGLYCERIN  (NITROSTAT ) 0.4 MG SL tablet    Sig: Place 1 tablet (0.4 mg total) under the tongue every 5 (five) minutes as needed for chest pain.    Dispense:  90 tablet    Refill:  3   clopidogrel  (PLAVIX ) 75 MG tablet    Sig: Take 1 tablet (75 mg total) by mouth daily.    Dispense:  30 tablet    Refill:  11   atorvastatin  (LIPITOR) 80 MG tablet    Sig: Take 1 tablet (80 mg total) by mouth daily.    Dispense:  90 tablet    Refill:  3   amLODipine (NORVASC) 5 MG tablet    Sig: Take 1 tablet (5 mg total) by mouth daily.    Dispense:  90 tablet    Refill:  3    Patient Instructions  Medication Instructions:  Refills have been sent to your pharmacy. Norvasc, Plavix , and Lipitor  Take the Nitroglycerin  as needed for chest pain.   *If you need a refill on your cardiac medications before your next appointment, please call your pharmacy*   Lab Work: No labs were ordered during today's visit.  If you have labs (blood work) drawn today and your tests are completely normal, you  will receive your results only by: MyChart Message (if you have MyChart) OR A paper copy in the mail If you have any lab test that is abnormal or we need to change your treatment, we will call you to review the results.   Testing/Procedures: No procedures were ordered during today's visit.    Follow-Up: At [facility], you  and your health needs are our priority.  As part of our continuing mission to provide you with exceptional heart care, we have created designated Provider Care Teams.  These Care Teams include your primary Cardiologist (physician) and Advanced Practice Providers (APPs -  Physician Assistants and Nurse Practitioners) who all work together to provide you with the care you need, when you need it.  We recommend signing up for the patient portal called MyChart.  Sign up information is provided on this After Visit Summary.  MyChart is used to connect with patients for Virtual Visits (Telemedicine).  Patients are able to view lab/test results, encounter notes, upcoming appointments, etc.  Non-urgent messages can be sent to your provider as well.   To learn more about what you can do with MyChart, go to [email or web address].    Your next appointment:   6 month(s)  Provider:   Kardie Tobb, DO or Jon Hails   Other Instructions Thank you for choosing [facility]!       Signed, Jon Nat Hails, [place]  07/26/2023 10:53 AM    [facility]

## 2023-07-25 LAB — COLOGUARD: COLOGUARD: NEGATIVE

## 2023-07-26 ENCOUNTER — Encounter: Payer: Self-pay | Admitting: Physician Assistant

## 2023-07-26 ENCOUNTER — Ambulatory Visit: Attending: Physician Assistant | Admitting: Physician Assistant

## 2023-07-26 VITALS — BP 134/68 | HR 76 | Ht 63.5 in | Wt 123.0 lb

## 2023-07-26 DIAGNOSIS — Z01810 Encounter for preprocedural cardiovascular examination: Secondary | ICD-10-CM | POA: Insufficient documentation

## 2023-07-26 DIAGNOSIS — I2510 Atherosclerotic heart disease of native coronary artery without angina pectoris: Secondary | ICD-10-CM | POA: Insufficient documentation

## 2023-07-26 DIAGNOSIS — Z79899 Other long term (current) drug therapy: Secondary | ICD-10-CM | POA: Diagnosis not present

## 2023-07-26 DIAGNOSIS — I10 Essential (primary) hypertension: Secondary | ICD-10-CM | POA: Insufficient documentation

## 2023-07-26 DIAGNOSIS — Z720 Tobacco use: Secondary | ICD-10-CM | POA: Insufficient documentation

## 2023-07-26 DIAGNOSIS — E782 Mixed hyperlipidemia: Secondary | ICD-10-CM | POA: Diagnosis not present

## 2023-07-26 MED ORDER — ATORVASTATIN CALCIUM 80 MG PO TABS: 80.0000 mg | ORAL_TABLET | Freq: Every day | ORAL | 3 refills | Status: AC

## 2023-07-26 MED ORDER — AMLODIPINE BESYLATE 5 MG PO TABS: 5.0000 mg | ORAL_TABLET | Freq: Every day | ORAL | 3 refills | Status: AC

## 2023-07-26 MED ORDER — NITROGLYCERIN 0.4 MG SL SUBL: 0.4000 mg | SUBLINGUAL_TABLET | SUBLINGUAL | 3 refills | Status: AC | PRN

## 2023-07-26 MED ORDER — CLOPIDOGREL BISULFATE 75 MG PO TABS: 75.0000 mg | ORAL_TABLET | Freq: Every day | ORAL | 11 refills | Status: AC

## 2023-07-26 NOTE — Patient Instructions (Signed)
 Medication Instructions:  Refills have been sent to your pharmacy. Norvasc, Plavix , and Lipitor  Take the Nitroglycerin  as needed for chest pain.   *If you need a refill on your cardiac medications before your next appointment, please call your pharmacy*   Lab Work: No labs were ordered during today's visit.  If you have labs (blood work) drawn today and your tests are completely normal, you will receive your results only by: MyChart Message (if you have MyChart) OR A paper copy in the mail If you have any lab test that is abnormal or we need to change your treatment, we will call you to review the results.   Testing/Procedures: No procedures were ordered during today's visit.    Follow-Up: At [facility], you and your health needs are our priority.  As part of our continuing mission to provide you with exceptional heart care, we have created designated Provider Care Teams.  These Care Teams include your primary Cardiologist (physician) and Advanced Practice Providers (APPs -  Physician Assistants and Nurse Practitioners) who all work together to provide you with the care you need, when you need it.  We recommend signing up for the patient portal called MyChart.  Sign up information is provided on this After Visit Summary.  MyChart is used to connect with patients for Virtual Visits (Telemedicine).  Patients are able to view lab/test results, encounter notes, upcoming appointments, etc.  Non-urgent messages can be sent to your provider as well.   To learn more about what you can do with MyChart, go to [email or web address].    Your next appointment:   6 month(s)  Provider:   Kardie Tobb, DO or Jon Hails   Other Instructions Thank you for choosing [facility]!

## 2023-07-31 ENCOUNTER — Telehealth: Payer: Self-pay | Admitting: Cardiology

## 2023-07-31 NOTE — Telephone Encounter (Signed)
 Left message for patient to call back

## 2023-07-31 NOTE — Telephone Encounter (Signed)
 Pt requesting a c/b in regards to her last appt.

## 2023-08-07 ENCOUNTER — Telehealth: Payer: Self-pay

## 2023-08-07 NOTE — Telephone Encounter (Signed)
   Patient Name: Haley Turner  DOB: 01/12/1961 MRN: [id number]  Primary Cardiologist: Kardie Tobb, DO  Chart reviewed as part of pre-operative protocol coverage. Given past medical history and time since last visit, based on ACC/AHA guidelines, Haley Turner is at acceptable risk for the planned procedure without further cardiovascular testing.   Preoperative risk evaluation prior to extraction of one tooth.  She has extensive CAD that was not amenable to PCI. She remains active and can complete more than 4.0 METS. She has no unstable cardiac issues. She has a 0.9% risk of MACE for any procedure. Risk with this procedure may be less since she is not having general anesthesia. No further cardiac workup prior to dental work. We typically do not hold antiplatelet therapy for extraction of 1 tooth. If needed, may hold plavix  for 5 days while taking ASA without interruption.   The patient was advised that if she develops new symptoms prior to surgery to contact our office to arrange for a follow-up visit, and she verbalized understanding.  I will route this recommendation to the requesting party via Epic fax function and remove from pre-op pool.  Please call with questions.  Lamarr Satterfield, NP 08/07/2023, 1:28 PM

## 2023-08-07 NOTE — Telephone Encounter (Signed)
   Pre-operative Risk Assessment    Patient Name: Haley Turner  DOB: 1960-02-25 MRN: [id number]   Date of last office visit: 07/26/23 JON HAILS, PA Date of next office visit: 01/28/24 JON HAILS, PA  Request for Surgical Clearance    Procedure:  Dental Extraction - Amount of Teeth to be Pulled:  1 TEETH, 3 FILLINGS, CLEANING: SCALING & ROOT PLANING  Date of Surgery:  Clearance 08/22/23                                Surgeon:  NOT INDICATED Surgeon's Group or Practice Name:  [facility] Phone number:  [phone] Fax number:  [phone]   Type of Clearance Requested:   - Medical  - Pharmacy:  Hold Aspirin  and Clopidogrel  (Plavix )     Type of Anesthesia:  Local (WITH EPINEPHRINE)   Additional requests/questions:    Signed, Lucie DELENA Ku   08/07/2023, 12:24 PM

## 2023-09-04 ENCOUNTER — Ambulatory Visit: Admitting: Pharmacist

## 2023-09-05 ENCOUNTER — Telehealth: Payer: Self-pay

## 2023-09-05 NOTE — Telephone Encounter (Signed)
   Pre-operative Risk Assessment    Patient Name: Haley Turner  DOB: 17-Oct-1960 MRN: [id number]   Date of last office visit: 07/26/2023, Jon Hails, PA Date of next office visit: 01/28/2024, Jon Hails, PA   Request for Surgical Clearance    Procedure:  Total Shoulder Replacement   Date of Surgery:  Clearance TBD                                Surgeon: Dr. Ozell Conger, MD Surgeon's Group or Practice Name: [facility] Phone number:[phone] Fax number: [phone]   Type of Clearance Requested:   - Medical  - Pharmacy:  Hold Aspirin  and Clopidogrel  (Plavix ) 7 days   Type of Anesthesia:  General    Additional requests/questions:    Bonney Asberry KANDICE Ethelene   09/05/2023, 4:00 PM

## 2023-09-11 NOTE — Telephone Encounter (Signed)
  Patient called to follow up on if she needs an appointment or not

## 2023-09-13 NOTE — Telephone Encounter (Signed)
   Name: Haley Turner  DOB: 1960-02-04  MRN: [id number]  Primary Cardiologist: Kardie Tobb, DO   Preoperative team, please contact this patient and set up a phone call appointment for further preoperative risk assessment. Please obtain consent and complete medication review. Thank you for your help. Did not hear back from APP to clear. This will expedite clearance.   I confirm that guidance regarding antiplatelet and oral anticoagulation therapy has been completed and, if necessary, noted below:  Per office protocol, if patient is without any new symptoms or concerns at the time of their virtual visit, she may hold Plaqvix for 5 days, and aspirin  for 7  days prior to procedure. Please resume aspirin  and plavix  as soon as possible postprocedure, at the discretion of the surgeon.    I also confirmed the patient resides in the state of [place] . As per [facility] Medical Board telemedicine laws, the patient must reside in the state in which the provider is licensed.   Lamarr Satterfield, NP 09/13/2023, 10:22 AM [facility]

## 2023-09-16 NOTE — Telephone Encounter (Signed)
 2nd attempt to reach pt regarding surgical clearance and the need for an TELE appointment.  Left pt a detailed message to call back and get that scheduled.

## 2023-09-16 NOTE — Telephone Encounter (Signed)
 8/15** 1st attempt to reach pt regarding surgical clearance and the need for an TELE appointment.  Left pt a detailed message to call back and get that scheduled.

## 2023-09-18 NOTE — Telephone Encounter (Signed)
 3rd attempt to reach pt to schedule a tele preop appt.   I will update the requesting office.

## 2023-09-27 ENCOUNTER — Telehealth: Payer: Self-pay | Admitting: *Deleted

## 2023-09-27 NOTE — Telephone Encounter (Signed)
 Patient returned Pre-Op call.

## 2023-09-27 NOTE — Telephone Encounter (Signed)
 Pt has been scheduled tele preop appt 10/10/23. Med rec and consent are done.       Patient Consent for Virtual Visit        Haley Turner has provided verbal consent on 09/27/2023 for a virtual visit (video or telephone).   CONSENT FOR VIRTUAL VISIT FOR:  Haley Turner  By participating in this virtual visit I agree to the following:  I hereby voluntarily request, consent and authorize [facility] and its employed or contracted physicians, physician assistants, nurse practitioners or other licensed health care professionals (the Practitioner), to provide me with telemedicine health care services (the "Services) as deemed necessary by the treating Practitioner. I acknowledge and consent to receive the Services by the Practitioner via telemedicine. I understand that the telemedicine visit will involve communicating with the Practitioner through live audiovisual communication technology and the disclosure of certain medical information by electronic transmission. I acknowledge that I have been given the opportunity to request an in-person assessment or other available alternative prior to the telemedicine visit and am voluntarily participating in the telemedicine visit.  I understand that I have the right to withhold or withdraw my consent to the use of telemedicine in the course of my care at any time, without affecting my right to future care or treatment, and that the Practitioner or I may terminate the telemedicine visit at any time. I understand that I have the right to inspect all information obtained and/or recorded in the course of the telemedicine visit and may receive copies of available information for a reasonable fee.  I understand that some of the potential risks of receiving the Services via telemedicine include:  Delay or interruption in medical evaluation due to technological equipment failure or disruption; Information transmitted may not be sufficient (e.g. poor  resolution of images) to allow for appropriate medical decision making by the Practitioner; and/or  In rare instances, security protocols could fail, causing a breach of personal health information.  Furthermore, I acknowledge that it is my responsibility to provide information about my medical history, conditions and care that is complete and accurate to the best of my ability. I acknowledge that Practitioner's advice, recommendations, and/or decision may be based on factors not within their control, such as incomplete or inaccurate data provided by me or distortions of diagnostic images or specimens that may result from electronic transmissions. I understand that the practice of medicine is not an exact science and that Practitioner makes no warranties or guarantees regarding treatment outcomes. I acknowledge that a copy of this consent can be made available to me via my patient portal [facility] MyChart), or I can request a printed copy by calling the office of [facility].    I understand that my insurance will be billed for this visit.   I have read or had this consent read to me. I understand the contents of this consent, which adequately explains the benefits and risks of the Services being provided via telemedicine.  I have been provided ample opportunity to ask questions regarding this consent and the Services and have had my questions answered to my satisfaction. I give my informed consent for the services to be provided through the use of telemedicine in my medical care

## 2023-09-27 NOTE — Telephone Encounter (Signed)
 Pt has been scheduled tele preop appt 10/10/23. Med rec and consent are done.

## 2023-10-10 ENCOUNTER — Ambulatory Visit: Attending: Cardiology

## 2023-10-10 DIAGNOSIS — Z01810 Encounter for preprocedural cardiovascular examination: Secondary | ICD-10-CM | POA: Diagnosis not present

## 2023-10-10 NOTE — Progress Notes (Signed)
 Virtual Visit via Telephone Note   Because of AILINE HEFFERAN co-morbid illnesses, she is at least at moderate risk for complications without adequate follow up.  This format is felt to be most appropriate for this patient at this time.  Due to technical limitations with video connection Web designer), today's appointment will be conducted as an audio only telehealth visit, and Haley Turner verbally agreed to proceed in this manner.   All issues noted in this document were discussed and addressed.  No physical exam could be performed with this format.  Evaluation Performed:  Preoperative cardiovascular risk assessment _____________   Date:  10/10/2023   Patient ID:  Haley Turner, DOB October 05, 1960, MRN [id number] Patient Location:  Home Provider location:   Office  Primary Care Provider:  Earvin Johnston PARAS, FNP Primary Cardiologist:  Kardie Tobb, DO  Chief Complaint / Patient Profile   63 y.o. y/o female with a h/o hypertension, hyperlipidemia, depression, myopericarditis in 2013, tobacco abuse, and CAD who is pending total shoulder replacement and presents today for telephonic preoperative cardiovascular risk assessment.  History of Present Illness    Haley Turner is a 63 y.o. female who presents via audio/video conferencing for a telehealth visit today.  Pt was last seen in cardiology clinic on 07/26/2023 by Jon Hails, PA-C.  At that time Haley Turner was doing well.  The patient is now pending procedure as outlined above. Since her last visit, she has been doing well from a cardiac standpoint. She is having trouble with her acid reflux. No chest pain or pressure or SOB. She has a membership to a gym which she does not use regularly. She has a blower which she uses in her yard. 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 For this reason she does meet mets.   Per office protocol, if patient is without any new symptoms or concerns at the time of their virtual visit, she  may hold Plavix  for 5 days, and aspirin  for 7  days prior to procedure. Please resume aspirin  and plavix  as soon as possible postprocedure, at the discretion of the surgeon.   Past Medical History    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   debrided, left foot, sub 2nd MT head:  in [place], [place] clinic:  [facility]   Suicide ideation    Past Surgical History:  Procedure Laterality Date   CARDIAC CATHETERIZATION     LEFT HEART CATH AND CORONARY ANGIOGRAPHY N/A 03/06/2022   Procedure: LEFT HEART CATH AND CORONARY ANGIOGRAPHY;  Surgeon: Dann Candyce RAMAN, MD;  Location: [facility] INVASIVE CV LAB;  Service: Cardiovascular;  Laterality: N/A;   LEFT HEART CATHETERIZATION WITH CORONARY ANGIOGRAM N/A 03/12/2011   Procedure: LEFT HEART CATHETERIZATION WITH CORONARY ANGIOGRAM;  Surgeon: Candyce RAMAN Dann, MD;  Location: [facility] CATH LAB;  Service: Cardiovascular;  Laterality: N/A;   NO PAST SURGERIES      Allergies  Allergies  Allergen Reactions   Sulfonamide Derivatives Hives   Sulfur    Sulfur Hexafluoride Hives    Home Medications    Prior to Admission medications   Medication Sig Start Date End Date Taking? Authorizing Provider  acetaminophen  (TYLENOL ) 325 MG tablet Take 650 mg by mouth every 6 (six) hours as needed for moderate pain. Patient not taking: Reported on 09/27/2023    [provider]  albuterol  (VENTOLIN  HFA) 108 (  90 Base) MCG/ACT inhaler Inhale 2 puffs into the lungs every 4 (four) hours as needed for wheezing or shortness of breath. Patient not taking: Reported on 09/27/2023    [provider]  amLODipine  (NORVASC ) 5 MG tablet Take 1 tablet (5 mg total) by mouth daily. 07/26/23   Madie Jon Garre, PA  aspirin  EC 81 MG tablet Take 1 tablet by mouth daily.    [provider]  atorvastatin  (LIPITOR) 40 MG tablet Take 2 tablets (80 mg total) by mouth daily.  Follow up with cardiology for repeat labs. 03/08/22 09/27/23  Perri DELENA Meliton Mickey., MD  atorvastatin  (LIPITOR) 80 MG tablet Take 1 tablet (80 mg total) by mouth daily. 07/26/23 07/25/24  Madie Jon Garre, PA  buPROPion (WELLBUTRIN XL) 150 MG 24 hr tablet Take 1 tablet by mouth daily. 07/17/22   [provider]  clopidogrel  (PLAVIX ) 75 MG tablet Take 1 tablet (75 mg total) by mouth daily. 07/26/23 07/25/24  [facility], Jon Garre, PA  latanoprost (XALATAN) 0.005 % ophthalmic solution Place 1 drop into both eyes at bedtime. 07/10/23 07/09/24  [provider]  methocarbamol  (ROBAXIN ) 500 MG tablet Take 1 tablet (500 mg total) by mouth 2 (two) times daily. Patient taking differently: Take 500 mg by mouth as needed. 12/31/22   Gretta Bertrum ORN, PA-C  nicotine  (NICODERM CQ  - DOSED IN MG/24 HOURS) 21 mg/24hr patch Place 1 patch (21 mg total) onto the skin daily. Patient not taking: Reported on 05/06/2023 03/07/22   Perri DELENA Meliton Mickey., MD  nitroGLYCERIN  (NITROSTAT ) 0.4 MG SL tablet Place 1 tablet (0.4 mg total) under the tongue every 5 (five) minutes as needed for chest pain. 07/26/23 10/24/23  Madie Jon Garre, PA  sertraline  (ZOLOFT ) 25 MG tablet Take 25 mg by mouth daily. 02/07/23   [provider]  Vitamin D , Ergocalciferol , (DRISDOL) 1.25 MG (50000 UNIT) CAPS capsule Take 50,000 Units by mouth every 7 (seven) days. 02/05/23   [provider]   Physical Exam    Vital Signs:  Haley Turner does not have vital signs available for review today.  Given telephonic nature of communication, physical exam is limited. AAOx3. NAD. Normal affect.  Speech and respirations are unlabored.  Accessory Clinical Findings    None  Assessment & Plan    1.  Preoperative Cardiovascular Risk Assessment:  Haley Turner's perioperative risk of a major cardiac event is 0.4% according to the Revised Cardiac Risk Index (RCRI).  Therefore, she is at low risk for perioperative complications.   Her  functional capacity is good at 5.69 METs according to the [facility] Activity Status Index (DASI). Recommendations: According to ACC/AHA guidelines, no further cardiovascular testing needed.  The patient may proceed to surgery at acceptable risk.   Antiplatelet and/or Anticoagulation Recommendations: Aspirin  can be held for 7 days prior to her surgery.  Please resume Aspirin  post operatively when it is felt to be safe from a bleeding standpoint.  Clopidogrel  (Plavix ) can be held for 5 days prior to her surgery and resumed as soon as possible post op.  The patient was advised that if she develops new symptoms prior to surgery to contact our office to arrange for a follow-up visit, and she verbalized understanding.  A copy of this note will be routed to requesting surgeon.  Time:   Today, I have spent 10 minutes with the patient with telehealth technology discussing medical history, symptoms, and management plan.     Orren LOISE Fabry, PA-C  10/10/2023, 8:25 AM

## 2023-10-10 NOTE — Addendum Note (Signed)
 Addended by: LUCIEN ORREN SAILOR on: 10/10/2023 10:14 AM   Modules accepted: Level of Service

## 2023-10-30 DIAGNOSIS — R6889 Other general symptoms and signs: Secondary | ICD-10-CM | POA: Diagnosis not present

## 2023-10-30 DIAGNOSIS — F17218 Nicotine dependence, cigarettes, with other nicotine-induced disorders: Secondary | ICD-10-CM | POA: Diagnosis not present

## 2023-10-30 DIAGNOSIS — M25512 Pain in left shoulder: Secondary | ICD-10-CM | POA: Diagnosis not present

## 2023-10-30 DIAGNOSIS — R053 Chronic cough: Secondary | ICD-10-CM | POA: Diagnosis not present

## 2023-10-30 DIAGNOSIS — G8929 Other chronic pain: Secondary | ICD-10-CM | POA: Diagnosis not present

## 2023-10-30 DIAGNOSIS — G4762 Sleep related leg cramps: Secondary | ICD-10-CM | POA: Diagnosis not present

## 2023-10-30 DIAGNOSIS — I214 Non-ST elevation (NSTEMI) myocardial infarction: Secondary | ICD-10-CM | POA: Diagnosis not present

## 2023-10-30 DIAGNOSIS — J302 Other seasonal allergic rhinitis: Secondary | ICD-10-CM | POA: Diagnosis not present

## 2023-10-30 DIAGNOSIS — I10 Essential (primary) hypertension: Secondary | ICD-10-CM | POA: Diagnosis not present

## 2023-10-30 DIAGNOSIS — R350 Frequency of micturition: Secondary | ICD-10-CM | POA: Diagnosis not present

## 2023-10-30 DIAGNOSIS — K219 Gastro-esophageal reflux disease without esophagitis: Secondary | ICD-10-CM | POA: Diagnosis not present

## 2023-11-07 DIAGNOSIS — R52 Pain, unspecified: Secondary | ICD-10-CM | POA: Diagnosis not present

## 2023-11-07 DIAGNOSIS — J029 Acute pharyngitis, unspecified: Secondary | ICD-10-CM | POA: Diagnosis not present

## 2023-11-07 DIAGNOSIS — B950 Streptococcus, group A, as the cause of diseases classified elsewhere: Secondary | ICD-10-CM | POA: Diagnosis not present

## 2023-11-29 DIAGNOSIS — Z1231 Encounter for screening mammogram for malignant neoplasm of breast: Secondary | ICD-10-CM | POA: Diagnosis not present

## 2023-11-29 DIAGNOSIS — F330 Major depressive disorder, recurrent, mild: Secondary | ICD-10-CM | POA: Diagnosis not present

## 2023-11-29 DIAGNOSIS — G4762 Sleep related leg cramps: Secondary | ICD-10-CM | POA: Diagnosis not present

## 2023-11-29 DIAGNOSIS — R202 Paresthesia of skin: Secondary | ICD-10-CM | POA: Diagnosis not present

## 2023-11-29 DIAGNOSIS — R87618 Other abnormal cytological findings on specimens from cervix uteri: Secondary | ICD-10-CM | POA: Diagnosis not present

## 2023-11-29 DIAGNOSIS — N760 Acute vaginitis: Secondary | ICD-10-CM | POA: Diagnosis not present

## 2023-11-29 DIAGNOSIS — R3915 Urgency of urination: Secondary | ICD-10-CM | POA: Diagnosis not present

## 2023-11-29 DIAGNOSIS — Z124 Encounter for screening for malignant neoplasm of cervix: Secondary | ICD-10-CM | POA: Diagnosis not present

## 2023-11-29 DIAGNOSIS — R8290 Unspecified abnormal findings in urine: Secondary | ICD-10-CM | POA: Diagnosis not present

## 2023-11-29 DIAGNOSIS — R87610 Atypical squamous cells of undetermined significance on cytologic smear of cervix (ASC-US): Secondary | ICD-10-CM | POA: Diagnosis not present

## 2023-11-29 DIAGNOSIS — F419 Anxiety disorder, unspecified: Secondary | ICD-10-CM | POA: Diagnosis not present

## 2023-12-02 ENCOUNTER — Other Ambulatory Visit: Payer: Self-pay | Admitting: Family Medicine

## 2023-12-02 ENCOUNTER — Encounter: Payer: Self-pay | Admitting: Radiology

## 2023-12-02 DIAGNOSIS — Z1231 Encounter for screening mammogram for malignant neoplasm of breast: Secondary | ICD-10-CM

## 2024-01-03 DIAGNOSIS — F17218 Nicotine dependence, cigarettes, with other nicotine-induced disorders: Secondary | ICD-10-CM | POA: Diagnosis not present

## 2024-01-03 DIAGNOSIS — M19012 Primary osteoarthritis, left shoulder: Secondary | ICD-10-CM | POA: Diagnosis not present

## 2024-01-03 DIAGNOSIS — R202 Paresthesia of skin: Secondary | ICD-10-CM | POA: Diagnosis not present

## 2024-01-03 DIAGNOSIS — F418 Other specified anxiety disorders: Secondary | ICD-10-CM | POA: Diagnosis not present

## 2024-01-03 DIAGNOSIS — J029 Acute pharyngitis, unspecified: Secondary | ICD-10-CM | POA: Diagnosis not present

## 2024-01-03 DIAGNOSIS — I214 Non-ST elevation (NSTEMI) myocardial infarction: Secondary | ICD-10-CM | POA: Diagnosis not present

## 2024-01-03 DIAGNOSIS — Z0001 Encounter for general adult medical examination with abnormal findings: Secondary | ICD-10-CM | POA: Diagnosis not present

## 2024-01-03 DIAGNOSIS — E782 Mixed hyperlipidemia: Secondary | ICD-10-CM | POA: Diagnosis not present

## 2024-01-03 DIAGNOSIS — I10 Essential (primary) hypertension: Secondary | ICD-10-CM | POA: Diagnosis not present

## 2024-01-03 DIAGNOSIS — Z7182 Exercise counseling: Secondary | ICD-10-CM | POA: Diagnosis not present

## 2024-01-03 DIAGNOSIS — L84 Corns and callosities: Secondary | ICD-10-CM | POA: Diagnosis not present

## 2024-01-26 NOTE — Progress Notes (Unsigned)
 " Cardiology Office Note:    Date:  01/28/2024   ID:  Haley Turner, DOB November 22, 1960, MRN [id number]  PCP:  Earvin Johnston PARAS, FNP   [facility] Providers Cardiologist:  Dub Huntsman, DO Cardiology APP:  Madie Jon Garre, PA     Referring MD: Earvin Johnston PARAS, FNP   Chief Complaint  Patient presents with   Follow-up    CAD    History of Present Illness:    Haley Turner is a 63 y.o. female with a hx of HTN, HLD, depression, myopericarditis in 2013, tobacco abuse, and CAD. Heart cath 03/2022 with severe ostial LCX disease with ulcerated plaque that extends into the distal left main. Due to location, plaque not felt amenable to PCI due to jailing LAD. She was treated medically with DAPT x 12 months. Echocardiogram showed LVEF 58% with no RWMA, and no significant valvular disease. LE dopplers negative for PAD.   I saw her for preoperative risk evaluation 06/2023.  She was recently recommended to undergo shoulder surgery via telehealth visit 10/10/2023.  She presents today for routine cardiology follow-up and preoperative risk evaluation for left shoulder surgery  - she didn't have the surgery due to anxiety regarding anesthesia. She has experienced right sided shoulder and axillae numbness and tingling.   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 She experiences this with laying down or resting and seems random, but is increasing in frequency. At this point she is not able to complete 4.0 METS and is having new symptoms that could be her anginal equivalent. She reports she can walk a flight of stairs, but can't recall when she last did this.   She reports new toe burning and pain - recent ABI reassuring with no significant PAD. On exam, left 5th toe with some black discoloration but good pedal pulse.   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   debrided, left  foot, sub 2nd MT head:  in [place], [place] clinic:  [facility]   Suicide ideation     Past Surgical History:  Procedure Laterality Date   CARDIAC CATHETERIZATION     LEFT HEART CATH AND CORONARY ANGIOGRAPHY N/A 03/06/2022   Procedure: LEFT HEART CATH AND CORONARY ANGIOGRAPHY;  Surgeon: Dann Candyce RAMAN, MD;  Location: [facility] INVASIVE CV LAB;  Service: Cardiovascular;  Laterality: N/A;   LEFT HEART CATHETERIZATION WITH CORONARY ANGIOGRAM N/A 03/12/2011   Procedure: LEFT HEART CATHETERIZATION WITH CORONARY ANGIOGRAM;  Surgeon: Candyce RAMAN Dann, MD;  Location: [facility] CATH LAB;  Service: Cardiovascular;  Laterality: N/A;   NO PAST SURGERIES      Current Medications: Active Medications[1]   Allergies:   Sulfonamide derivatives, Sulfur, and Sulfur hexafluoride   Social History   Socioeconomic History   Marital status: Single    Spouse name: Not on file   Number of children: Not on file   Years of education: Not on file   Highest education level: Not on file  Occupational History   Not on file  Tobacco Use   Smoking status: Some Days    Current packs/day: 0.00    Average packs/day: 0.5 packs/day    Types: Cigarettes    Last attempt to quit: 01/29/2012    Years since quitting: 12.0   Smokeless tobacco: Never   Tobacco comments:    07/26/2023 Patient smokes 2 cigarettes daily  Vaping Use  Vaping status: Never Used  Substance and Sexual Activity   Alcohol use: Yes    Alcohol/week: 1.0 standard drink of alcohol    Types: 1 Shots of liquor per week    Comment: Socially   Drug use: No   Sexual activity: Yes    Birth control/protection: Condom  Other Topics Concern   Not on file  Social History Narrative   Not on file   Social Drivers of Health   Tobacco Use: High Risk (01/28/2024)   Patient History    Smoking Tobacco Use: Some Days    Smokeless Tobacco Use: Never    Passive Exposure: Not on file  Financial Resource Strain: Not at Risk (01/03/2024)    Received from [employer]    How hard is it for you to pay for the very basics like food, housing, heating, medical care, and medications?: 1  Food Insecurity: At Risk (01/03/2024)   Received from [facility] Insecurity    Within the past 12 months, you worried that your food would run out before you got money to buy more.: 2  Transportation Needs: Not at Risk (01/03/2024)   Received from [facility] Needs    In the past 12 months, has lack of transportation kept you from medical appointments, meetings, work or from getting things needed for daily living?: 1  Physical Activity: Not on File (05/18/2021)   Received from [facility]   Physical Activity    Physical Activity: 0  Stress: Not on File (05/18/2021)   Received from [facility]   Stress    Stress: 0  Social Connections: Not on File (10/25/2022)   Received from [facility]   Social Connections    Connectedness: 0  Depression (PHQ2-9): Not on file  Alcohol Screen: Not on file  Housing: Low Risk (03/07/2022)   Housing    Last Housing Risk Score: 0  Utilities: Not At Risk (03/07/2022)   AHC Utilities    Threatened with loss of utilities: No  Health Literacy: Not on file     Family History: The patient's family history includes Arthritis in her mother; Breast cancer in her sister; Hypertension in her brother, father, mother, and sister; Prostate cancer in her brother; Thyroid  disease in her brother.  ROS:   Please see the history of present illness.     All other systems reviewed and are negative.  EKGs/Labs/Other Studies Reviewed:    The following studies were reviewed today:  EKG Interpretation Date/Time:  Tuesday January 28 2024 10:39:18 EST Ventricular Rate:  85 PR Interval:  136 QRS Duration:  66 QT Interval:  356 QTC Calculation: 423 R Axis:   35  Text Interpretation: Normal sinus rhythm Normal ECG When compared with ECG of 24-Sep-2022 15:20, No significant change was found Confirmed by Madie Slough ([id number]) on 01/28/2024 10:51:14 AM    Recent Labs: No results found for requested labs within last 365 days.  Recent Lipid Panel    Component Value Date/Time   CHOL 175 03/06/2022 0105   CHOL 193 12/03/2017 0924   TRIG 64 03/06/2022 0105   HDL 43 03/06/2022 0105   HDL 59 12/03/2017 0924   CHOLHDL 4.1 03/06/2022 0105   VLDL 13 03/06/2022 0105   LDLCALC 119 (H) 03/06/2022 0105   LDLCALC 120 (H) 12/03/2017 0924     Risk Assessment/Calculations:                Physical Exam:    VS:  BP 136/76   Pulse 77   Ht 5' 3.5 (1.613 m)   Wt 115 lb 12.8 oz (52.5 kg)   SpO2 97%   BMI 20.19 kg/m     Wt Readings from Last 3 Encounters:  01/28/24 115 lb 12.8 oz (52.5 kg)  07/26/23 123 lb (55.8 kg)  09/24/22 125 lb 12.8 oz (57.1 kg)     GEN:  Well nourished, well developed in no acute distress HEENT: Normal NECK: No JVD; No carotid bruits LYMPHATICS: No lymphadenopathy CARDIAC: RRR, no murmurs, rubs, gallops RESPIRATORY:  Clear to auscultation without rales, wheezing or rhonchi  ABDOMEN: Soft, non-tender, non-distended MUSCULOSKELETAL:  left 5th toe with black discoloration, good pedal pulse, right 4th toe with discoloration between toes, she states is stable SKIN: Warm and dry NEUROLOGIC:  Alert and oriented x 3 PSYCHIATRIC:  Normal affect   ASSESSMENT:    1. Right arm pain   2. Coronary artery disease involving native coronary artery of native heart without angina pectoris   3. Essential hypertension   4. Tobacco use   5. Mixed hyperlipidemia   6. Preoperative cardiovascular examination   7. Discoloration of skin of toe    PLAN:    In order of problems listed above:  CAD - severe stenosis in the proximal LCX - focus on risk factor management - she was treated with DAPT x 12 months - she remains on ASA and Plavix , although occasionally misses ASA - would continue plavix , if she is able to proceed to surgery, would continue ASA and hold plavix  for 5 days,  resume after   Right arm pain - new onset symptoms, increasing in frequency - given upcoming surgery and known CAD, will obtain PET stress test   Hypertension - continue 5 mg amlodipine    Hyperlipidemia with LDL goal < 55 - given disease and smoking history, would attempt a lower LDL goal - continue 80 mg lipitor - previously referred to lipid clinic for inclisiran, she was not interested in PCSK9 inhibitor   Tobacco abuse - Cessation recommended   Toe discoloration - ABIs did not show PAD - good pulses - due to see neurology   Preoperative risk evaluation prior to shoulder surgery She does not report recent activity equivalent to 4.0 METS, and is now reporting new right arm numbness and tingling that is increasing in frequency.   Somewhat difficult situation given her known CAD with 90% ostial LCX occlusion not amenable to PCI. Suspect a PET stress test may be abnormal given this lesion, and this could lead to another angiography. However, given that she is no longer able to complete 4.0 METS due to new foot pain with toe discoloration and now with radiating right arm pain with numbness and tingling in the setting of upcoming shoulder surgery, I will move forward with the PET stress test.   Will send to Dr. Sheena to review.   Follow up in 2 months.       Medication Adjustments/Labs and Tests Ordered: Current medicines are reviewed at length with the patient today.  Concerns regarding medicines are outlined above.  Orders Placed This Encounter  Procedures   NM PET CT CARDIAC PERFUSION MULTI W/ABSOLUTE BLOODFLOW   EKG 12-Lead   No orders of the defined types were placed in this encounter.   Patient Instructions  Medication Instructions:  Your physician has recommended you make the following change in your medication:  STOP LIPITOR 40 MG CONTINUE FAMOTIDINE  20 MG   *If you need a  refill on your cardiac medications before your next appointment, please call your  pharmacy*   Testing/Procedures:    Please report to Radiology at the [facility] Main Entrance 30 minutes early for your test.  [address] [place], [place] [address]                         OR   Please report to Radiology at [facility] Main Entrance, medical mall, 30 mins prior to your test.  [address]  [place], [place]  How to Prepare for Your Cardiac PET/CT Stress Test:  Nothing to eat or drink, except water, 3 hours prior to arrival time.  NO caffeine/decaffeinated products, or chocolate 12 hours prior to arrival. (Please note decaffeinated beverages (teas/coffees) still contain caffeine).  If you have caffeine within 12 hours prior, the test will need to be rescheduled.  Medication instructions: Do not take erectile dysfunction medications for 72 hours prior to test (sildenafil, tadalafil) Do not take nitrates (isosorbide mononitrate, Ranexa) the day before or day of test Do not take tamsulosin the day before or morning of test Hold theophylline containing medications for 12 hours. Hold Dipyridamole 48 hours prior to the test.  Diabetic Preparation: If able to eat breakfast prior to 3 hour fasting, you may take all medications, including your insulin. Do not worry if you miss your breakfast dose of insulin - start at your next meal. If you do not eat prior to 3 hour fast-Hold all diabetes (oral and insulin) medications. Patients who wear a continuous glucose monitor MUST remove the device prior to scanning.  You may take your remaining medications with water.  NO perfume, cologne or lotion on chest or abdomen area. FEMALES - Please avoid wearing dresses to this appointment.  Total time is 1 to 2 hours; you may want to bring reading material for the waiting time.   In preparation for your appointment, medication and supplies will be purchased.  Appointment availability is limited, so if you need to cancel or reschedule,  please call the Radiology Department Scheduler at [phone] 24 hours in advance to avoid a cancellation fee of $100.00  What to Expect When you Arrive:  Once you arrive and check in for your appointment, you will be taken to a preparation room within the Radiology Department.  A technologist or Nurse will obtain your medical history, verify that you are correctly prepped for the exam, and explain the procedure.  Afterwards, an IV will be started in your arm and electrodes will be placed on your skin for EKG monitoring during the stress portion of the exam. Then you will be escorted to the PET/CT scanner.  There, staff will get you positioned on the scanner and obtain a blood pressure and EKG.  During the exam, you will continue to be connected to the EKG and blood pressure machines.  A small, safe amount of a radioactive tracer will be injected in your IV to obtain a series of pictures of your heart along with an injection of a stress agent.    After your Exam:  It is recommended that you eat a meal and drink a caffeinated beverage to counter act any effects of the stress agent.  Drink plenty of fluids for the remainder of the day and urinate frequently for the first couple of hours after the exam.  Your doctor will inform you of your test results within 7-10 business days.  For more information and frequently asked questions, please visit our website: [email or web address]  For questions about your test or how to prepare for your test, please call: Cardiac Imaging Nurse Navigators Office: [phone]   Follow-Up: At [facility], you and your health needs are our priority.  As part of our continuing mission to provide you with exceptional heart care, our providers are all part of one team.  This team includes your primary Cardiologist (physician) and Advanced Practice Providers or APPs (Physician Assistants and Nurse Practitioners) who all work together to provide you  with the care you need, when you need it.  Your next appointment:   WILL CALL AFTER PET STRESS   Provider:             Signed, Jon Nat Hails, PA  01/28/2024 11:59 AM    [facility]     [1]  Current Meds  Medication Sig   acetaminophen  (TYLENOL ) 325 MG tablet Take 650 mg by mouth every 6 (six) hours as needed for moderate pain.   albuterol  (VENTOLIN  HFA) 108 (90 Base) MCG/ACT inhaler Inhale 2 puffs into the lungs every 4 (four) hours as needed for wheezing or shortness of breath.   amLODipine  (NORVASC ) 5 MG tablet Take 1 tablet (5 mg total) by mouth daily.   aspirin  EC 81 MG tablet Take 1 tablet by mouth daily.   atorvastatin  (LIPITOR) 80 MG tablet Take 1 tablet (80 mg total) by mouth daily.   buPROPion (WELLBUTRIN XL) 150 MG 24 hr tablet Take 1 tablet by mouth daily.   cetirizine (ZYRTEC) 10 MG tablet Take 10 mg by mouth daily.   clopidogrel  (PLAVIX ) 75 MG tablet Take 1 tablet (75 mg total) by mouth daily.   famotidine  (PEPCID ) 20 MG tablet Take 20 mg by mouth.   latanoprost (XALATAN) 0.005 % ophthalmic solution Place 1 drop into both eyes at bedtime.   methocarbamol  (ROBAXIN ) 500 MG tablet Take 1 tablet (500 mg total) by mouth 2 (two) times daily. (Patient taking differently: Take 500 mg by mouth as needed.)   nitroGLYCERIN  (NITROSTAT ) 0.4 MG SL tablet Place 1 tablet (0.4 mg total) under the tongue every 5 (five) minutes as needed for chest pain.   sertraline  (ZOLOFT ) 25 MG tablet Take 25 mg by mouth daily.   Vitamin D , Ergocalciferol , (DRISDOL) 1.25 MG (50000 UNIT) CAPS capsule Take 50,000 Units by mouth every 7 (seven) days.   "

## 2024-01-27 ENCOUNTER — Ambulatory Visit
Admission: RE | Admit: 2024-01-27 | Discharge: 2024-01-27 | Disposition: A | Discharge status: Home / Self Care | Source: Ambulatory Visit | Attending: Family Medicine | Admitting: Family Medicine

## 2024-01-27 DIAGNOSIS — Z1231 Encounter for screening mammogram for malignant neoplasm of breast: Secondary | ICD-10-CM

## 2024-01-28 ENCOUNTER — Ambulatory Visit: Attending: Physician Assistant | Admitting: Physician Assistant

## 2024-01-28 ENCOUNTER — Encounter: Payer: Self-pay | Admitting: Physician Assistant

## 2024-01-28 VITALS — BP 136/76 | HR 77 | Ht 63.5 in | Wt 115.8 lb

## 2024-01-28 DIAGNOSIS — I2510 Atherosclerotic heart disease of native coronary artery without angina pectoris: Secondary | ICD-10-CM | POA: Diagnosis not present

## 2024-01-28 DIAGNOSIS — Z720 Tobacco use: Secondary | ICD-10-CM | POA: Diagnosis not present

## 2024-01-28 DIAGNOSIS — I10 Essential (primary) hypertension: Secondary | ICD-10-CM | POA: Diagnosis not present

## 2024-01-28 DIAGNOSIS — E782 Mixed hyperlipidemia: Secondary | ICD-10-CM | POA: Insufficient documentation

## 2024-01-28 DIAGNOSIS — M79601 Pain in right arm: Secondary | ICD-10-CM | POA: Insufficient documentation

## 2024-01-28 DIAGNOSIS — L819 Disorder of pigmentation, unspecified: Secondary | ICD-10-CM | POA: Insufficient documentation

## 2024-01-28 DIAGNOSIS — Z01810 Encounter for preprocedural cardiovascular examination: Secondary | ICD-10-CM | POA: Diagnosis not present

## 2024-01-28 NOTE — Patient Instructions (Signed)
 Medication Instructions:  Your physician has recommended you make the following change in your medication:  STOP LIPITOR 40 MG CONTINUE FAMOTIDINE  20 MG   *If you need a refill on your cardiac medications before your next appointment, please call your pharmacy*   Testing/Procedures:    Please report to Radiology at the [facility] Main Entrance 30 minutes early for your test.  [address] [place], [place] [address]                         OR   Please report to Radiology at [facility] Main Entrance, medical mall, 30 mins prior to your test.  [address]  [place], [place]  How to Prepare for Your Cardiac PET/CT Stress Test:  Nothing to eat or drink, except water, 3 hours prior to arrival time.  NO caffeine/decaffeinated products, or chocolate 12 hours prior to arrival. (Please note decaffeinated beverages (teas/coffees) still contain caffeine).  If you have caffeine within 12 hours prior, the test will need to be rescheduled.  Medication instructions: Do not take erectile dysfunction medications for 72 hours prior to test (sildenafil, tadalafil) Do not take nitrates (isosorbide mononitrate, Ranexa) the day before or day of test Do not take tamsulosin the day before or morning of test Hold theophylline containing medications for 12 hours. Hold Dipyridamole 48 hours prior to the test.  Diabetic Preparation: If able to eat breakfast prior to 3 hour fasting, you may take all medications, including your insulin. Do not worry if you miss your breakfast dose of insulin - start at your next meal. If you do not eat prior to 3 hour fast-Hold all diabetes (oral and insulin) medications. Patients who wear a continuous glucose monitor MUST remove the device prior to scanning.  You may take your remaining medications with water.  NO perfume, cologne or lotion on chest or abdomen area. FEMALES - Please avoid wearing dresses to this  appointment.  Total time is 1 to 2 hours; you may want to bring reading material for the waiting time.   In preparation for your appointment, medication and supplies will be purchased.  Appointment availability is limited, so if you need to cancel or reschedule, please call the Radiology Department Scheduler at [phone] 24 hours in advance to avoid a cancellation fee of $100.00  What to Expect When you Arrive:  Once you arrive and check in for your appointment, you will be taken to a preparation room within the Radiology Department.  A technologist or Nurse will obtain your medical history, verify that you are correctly prepped for the exam, and explain the procedure.  Afterwards, an IV will be started in your arm and electrodes will be placed on your skin for EKG monitoring during the stress portion of the exam. Then you will be escorted to the PET/CT scanner.  There, staff will get you positioned on the scanner and obtain a blood pressure and EKG.  During the exam, you will continue to be connected to the EKG and blood pressure machines.  A small, safe amount of a radioactive tracer will be injected in your IV to obtain a series of pictures of your heart along with an injection of a stress agent.    After your Exam:  It is recommended that you eat a meal and drink a caffeinated beverage to counter act any effects of the stress agent.  Drink plenty of fluids for the remainder of  the day and urinate frequently for the first couple of hours after the exam.  Your doctor will inform you of your test results within 7-10 business days.  For more information and frequently asked questions, please visit our website: [email or web address]  For questions about your test or how to prepare for your test, please call: Cardiac Imaging Nurse Navigators Office: [phone]   Follow-Up: At [facility], you and your health needs are our priority.  As part of our continuing mission  to provide you with exceptional heart care, our providers are all part of one team.  This team includes your primary Cardiologist (physician) and Advanced Practice Providers or APPs (Physician Assistants and Nurse Practitioners) who all work together to provide you with the care you need, when you need it.  Your next appointment:   WILL CALL AFTER PET STRESS   Provider:

## 2024-02-12 ENCOUNTER — Ambulatory Visit (INDEPENDENT_AMBULATORY_CARE_PROVIDER_SITE_OTHER): Admitting: Podiatry

## 2024-02-12 ENCOUNTER — Encounter: Payer: Self-pay | Admitting: Podiatry

## 2024-02-12 VITALS — Ht 63.5 in | Wt 115.8 lb

## 2024-02-12 DIAGNOSIS — D2372 Other benign neoplasm of skin of left lower limb, including hip: Secondary | ICD-10-CM

## 2024-02-21 ENCOUNTER — Encounter (HOSPITAL_COMMUNITY): Payer: Self-pay

## 2024-02-24 ENCOUNTER — Telehealth (HOSPITAL_COMMUNITY): Payer: Self-pay | Admitting: *Deleted

## 2024-02-24 NOTE — Telephone Encounter (Signed)
 Attempted to call patient regarding upcoming cardiac PET appointment. Left message on voicemail with name and callback number  Larey Brick RN Navigator Cardiac Imaging [facility] [phone] Office [phone] Cell

## 2024-02-25 ENCOUNTER — Encounter (HOSPITAL_COMMUNITY)
Admission: RE | Admit: 2024-02-25 | Discharge: 2024-02-25 | Disposition: A | Discharge status: Home / Self Care | Source: Ambulatory Visit | Attending: Physician Assistant | Admitting: Physician Assistant

## 2024-02-25 DIAGNOSIS — I2510 Atherosclerotic heart disease of native coronary artery without angina pectoris: Secondary | ICD-10-CM | POA: Insufficient documentation

## 2024-02-25 DIAGNOSIS — M79601 Pain in right arm: Secondary | ICD-10-CM | POA: Diagnosis not present

## 2024-02-25 LAB — NM PET CT CARDIAC PERFUSION MULTI W/ABSOLUTE BLOODFLOW
LV dias vol: 63 mL (ref 46–106)
LV sys vol: 19 mL
MBFR: 3.31
Nuc Rest EF: 70 %
Nuc Stress EF: 73 %
Peak HR: 103 {beats}/min
Rest HR: 62 {beats}/min
Rest MBF: 1.05 ml/g/min
Rest Nuclear Isotope Dose: 13.6 mCi
ST Depression (mm): 0 mm
Stress MBF: 3.48 ml/g/min
Stress Nuclear Isotope Dose: 13.4 mCi

## 2024-02-25 MED ORDER — RUBIDIUM RB82 GENERATOR (RUBYFILL)
13.3900 | PACK | Freq: Once | INTRAVENOUS | Status: AC
  Administered 2024-02-25: 13.39 via INTRAVENOUS

## 2024-02-25 MED ORDER — REGADENOSON 0.4 MG/5ML IV SOLN
INTRAVENOUS | Status: AC
  Filled 2024-02-25: qty 5

## 2024-02-25 MED ORDER — RUBIDIUM RB82 GENERATOR (RUBYFILL)
13.5600 | PACK | Freq: Once | INTRAVENOUS | Status: AC
  Administered 2024-02-25: 13.56 via INTRAVENOUS

## 2024-02-25 MED ORDER — REGADENOSON 0.4 MG/5ML IV SOLN
0.4000 mg | Freq: Once | INTRAVENOUS | Status: AC
  Administered 2024-02-25: 0.4 mg via INTRAVENOUS

## 2024-02-25 NOTE — Progress Notes (Signed)
" ° °  Chief Complaint  Patient presents with   Callouses    Pt is here due to callus to the bottom of both feet that are causing pain.    Subjective: 64 y.o. 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   debrided, left foot, sub 2nd MT head:  in [place], [place] clinic:  [facility]   Suicide ideation     Past Surgical History:  Procedure Laterality Date   CARDIAC CATHETERIZATION     LEFT HEART CATH AND CORONARY ANGIOGRAPHY N/A 03/06/2022   Procedure: LEFT HEART CATH AND CORONARY ANGIOGRAPHY;  Surgeon: Dann Candyce RAMAN, MD;  Location: [facility] INVASIVE CV LAB;  Service: Cardiovascular;  Laterality: N/A;   LEFT HEART CATHETERIZATION WITH CORONARY ANGIOGRAM N/A 03/12/2011   Procedure: LEFT HEART CATHETERIZATION WITH CORONARY ANGIOGRAM;  Surgeon: Candyce RAMAN Dann, MD;  Location: [facility] CATH LAB;  Service: Cardiovascular;  Laterality: N/A;   NO PAST SURGERIES      Allergies[1]   Objective:  Physical Exam General: Alert and oriented x3 in no acute distress  Dermatology: Hyperkeratotic lesion(s) present on the plantar aspect of the bilateral feet. Pain on palpation with a central nucleated core noted.   Vascular: Palpable pedal pulses bilaterally. No edema or erythema noted. Capillary refill within normal limits.  Neurological: Grossly intact via light touch  Musculoskeletal Exam: Range of motion within normal limits bilateral. Muscle strength 5/5 in all groups bilateral.  Assessment: 1.  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  -Salicylic acid  applied with a bandaid -Advised against going barefoot.  Recommend good  supportive shoes and sneakers -Recommend OTC salicylic acid  daily PRN -Return to the clinic PRN.   Thresa EMERSON Sar, DPM [facility]  Dr. Thresa EMERSON Sar, DPM    [address] [place], [place] [address]                Office [phone]  Fax [phone]        [1]  Allergies Allergen Reactions   Sulfonamide Derivatives Hives   Sulfur    Sulfur Hexafluoride Hives   "

## 2024-02-27 ENCOUNTER — Ambulatory Visit: Payer: Self-pay | Admitting: Physician Assistant

## 2024-02-28 NOTE — Telephone Encounter (Signed)
 Left a detail message with stress test results. Pt advised to call back if she has any questions or concerns. Results also sent to mychart.

## 2024-03-03 ENCOUNTER — Telehealth (HOSPITAL_BASED_OUTPATIENT_CLINIC_OR_DEPARTMENT_OTHER): Payer: Self-pay

## 2024-03-03 NOTE — Telephone Encounter (Signed)
 Request received today at 11:28 am. We need time to assess her chart and review the appropriate next steps.

## 2024-03-03 NOTE — Telephone Encounter (Signed)
Patient is following up on the status of her clearance.

## 2024-03-03 NOTE — Telephone Encounter (Signed)
"  ° °  Pre-operative Risk Assessment    Patient Name: Haley Turner  DOB: [birth year] MRN: [id number]   Date of last office visit: 01/28/2024 with Jon Nat Hails, PA Date of next office visit: None  Request for Surgical Clearance    Procedure:  Total shoulder replacement  Date of Surgery:  Clearance TBD                                 Surgeon:  Ozell Conger, MD Surgeon's Group or Practice Name:  [facility] Phone number:  [phone] Fax number:  [phone]   Type of Clearance Requested:   - Medical  - Pharmacy:  Hold Aspirin  and Clopidogrel  (Plavix ) -both for 7 days preop and guidance for how long we can hold it post op   Type of Anesthesia:  General    Additional requests/questions:  None  Signed, Patrcia Iverson CROME   03/03/2024, 11:28 AM   "

## 2024-03-04 NOTE — Telephone Encounter (Signed)
 Requesting office sent a duplicate request again today. See previous notes.    I will send to preop APP to review.

## 2024-03-05 NOTE — Telephone Encounter (Signed)
 1st attempt to reach pt regarding surgical clearance and the need for an TELE appointment. Unable to LVM as VM box is full

## 2024-03-05 NOTE — Telephone Encounter (Signed)
" ° °  Name: Haley Turner  DOB: 1960-10-12  MRN: [id number]  Primary Cardiologist: Kardie Tobb, DO  Chart reviewed as part of pre-operative protocol coverage. Because of Haley Turner's past medical history and time since last visit, she will require a follow-up telephone visit in order to better assess preoperative cardiovascular risk.  Pre-op covering staff: - Please schedule appointment and call patient to inform them. If patient already had an upcoming appointment within acceptable timeframe, please add pre-op clearance to the appointment notes so provider is aware. - Please contact requesting surgeon's office via preferred method (i.e, phone, fax) to inform them of need for appointment prior to surgery.  Would continue ASA throughout and hold plavix  for 5 days, resume after   Orren LOISE Fabry, PA-C  03/05/2024, 12:11 PM   "

## 2024-03-05 NOTE — Telephone Encounter (Signed)
 Requesting office has sent clearance request x 3 again today. I will send to preop APP to review if the pt has been cleared.   I will update the surgeon office and assure them that we do have the clearance request in process at this time with the preop team. Once the pt has been cleared our office will fax the clearance notes to you with any recommendations.

## 2024-03-06 NOTE — Telephone Encounter (Addendum)
 Requesting office sent another fax again today inquiring if the pt has been cleared. I called the office and s/w staff member in the their surgery dept. I explained to her that their office keeps sending the request. I stated that I sent back yesterday again that we have the request in process and the pt had cardiac testing that needs to be read by the cardiologist before the pt can be cleared. I stated that once we have the clearance we will be sure to fax notes. I was asked how long did we think this would take. I stated that I cannot answer that; that is up to when the cardiologist can read the test and provide if the pt has been cleared.   I was asked who sent us  the request today as she showed in her system that they sent the request to us  in Jan 2026. I stated the name on the form is [employer]. I was told that she will update Bethani as to the call and the information today. I stated all of this was on the cover sheet from yesterday that I sent as well to help keep them informed.    I will reach back out to the preop APP can we see if the pt has been cleared yet.

## 2024-03-06 NOTE — Telephone Encounter (Signed)
2nd attempt to reach the pt to schedule a tele pre op appt.

## 2024-03-06 NOTE — Telephone Encounter (Signed)
 Patient has not been cleared. See Orren Conte's note below

## 2024-05-06 ENCOUNTER — Ambulatory Visit: Admitting: Diagnostic Neuroimaging
